# Patient Record
Sex: Male | Born: 1950 | Race: White | Hispanic: No | Marital: Married | Smoking: Never smoker
Health system: Southern US, Community
[De-identification: ages and names within clinical notes are randomized; demographics above are authoritative.]

## PROBLEM LIST (undated history)

## (undated) DIAGNOSIS — G4730 Sleep apnea, unspecified: Secondary | ICD-10-CM

## (undated) DIAGNOSIS — N289 Disorder of kidney and ureter, unspecified: Secondary | ICD-10-CM

## (undated) DIAGNOSIS — K219 Gastro-esophageal reflux disease without esophagitis: Secondary | ICD-10-CM

## (undated) DIAGNOSIS — M1990 Unspecified osteoarthritis, unspecified site: Secondary | ICD-10-CM

## (undated) DIAGNOSIS — I10 Essential (primary) hypertension: Secondary | ICD-10-CM

## (undated) HISTORY — PX: JOINT REPLACEMENT: SHX530

## (undated) HISTORY — PX: TONSILLECTOMY: SUR1361

## (undated) HISTORY — PX: TUMOR REMOVAL: SHX12

---

## 2013-03-30 ENCOUNTER — Other Ambulatory Visit: Payer: Self-pay | Admitting: Gastroenterology

## 2013-06-19 ENCOUNTER — Encounter (HOSPITAL_COMMUNITY): Payer: Self-pay | Admitting: *Deleted

## 2013-06-22 ENCOUNTER — Encounter (HOSPITAL_COMMUNITY): Payer: Self-pay | Admitting: Pharmacy Technician

## 2013-07-13 ENCOUNTER — Other Ambulatory Visit: Payer: Self-pay | Admitting: Gastroenterology

## 2013-07-14 ENCOUNTER — Encounter (HOSPITAL_COMMUNITY): Payer: Self-pay | Admitting: Anesthesiology

## 2013-07-14 ENCOUNTER — Encounter (HOSPITAL_COMMUNITY)
Admission: RE | Disposition: A | Discharge status: Home / Self Care | Payer: Self-pay | Source: Ambulatory Visit | Attending: Gastroenterology

## 2013-07-14 ENCOUNTER — Ambulatory Visit (HOSPITAL_COMMUNITY): Payer: Federal, State, Local not specified - PPO | Admitting: Anesthesiology

## 2013-07-14 ENCOUNTER — Ambulatory Visit (HOSPITAL_COMMUNITY)
Admission: RE | Admit: 2013-07-14 | Discharge: 2013-07-14 | Disposition: A | Discharge status: Home / Self Care | Payer: Federal, State, Local not specified - PPO | Source: Ambulatory Visit | Attending: Gastroenterology | Admitting: Gastroenterology

## 2013-07-14 DIAGNOSIS — D129 Benign neoplasm of anus and anal canal: Secondary | ICD-10-CM | POA: Insufficient documentation

## 2013-07-14 DIAGNOSIS — Z1211 Encounter for screening for malignant neoplasm of colon: Secondary | ICD-10-CM | POA: Insufficient documentation

## 2013-07-14 DIAGNOSIS — Z800 Family history of malignant neoplasm of digestive organs: Secondary | ICD-10-CM | POA: Insufficient documentation

## 2013-07-14 DIAGNOSIS — D126 Benign neoplasm of colon, unspecified: Secondary | ICD-10-CM | POA: Insufficient documentation

## 2013-07-14 DIAGNOSIS — D128 Benign neoplasm of rectum: Secondary | ICD-10-CM | POA: Insufficient documentation

## 2013-07-14 DIAGNOSIS — J310 Chronic rhinitis: Secondary | ICD-10-CM | POA: Insufficient documentation

## 2013-07-14 DIAGNOSIS — Q602 Renal agenesis, unspecified: Secondary | ICD-10-CM | POA: Insufficient documentation

## 2013-07-14 DIAGNOSIS — E7800 Pure hypercholesterolemia, unspecified: Secondary | ICD-10-CM | POA: Insufficient documentation

## 2013-07-14 HISTORY — DX: Gastro-esophageal reflux disease without esophagitis: K21.9

## 2013-07-14 HISTORY — PX: COLONOSCOPY WITH PROPOFOL: SHX5780

## 2013-07-14 HISTORY — DX: Unspecified osteoarthritis, unspecified site: M19.90

## 2013-07-14 HISTORY — DX: Sleep apnea, unspecified: G47.30

## 2013-07-14 SURGERY — COLONOSCOPY WITH PROPOFOL
Anesthesia: Monitor Anesthesia Care

## 2013-07-14 MED ORDER — MIDAZOLAM HCL 5 MG/5ML IJ SOLN
INTRAMUSCULAR | Status: DC | PRN
  Administered 2013-07-14: 2 mg via INTRAVENOUS

## 2013-07-14 MED ORDER — LACTATED RINGERS IV SOLN
INTRAVENOUS | Status: DC
  Administered 2013-07-14: 09:00:00 via INTRAVENOUS

## 2013-07-14 MED ORDER — SODIUM CHLORIDE 0.9 % IV SOLN: INTRAVENOUS | Status: DC

## 2013-07-14 MED ORDER — PROPOFOL INFUSION 10 MG/ML OPTIME
INTRAVENOUS | Status: DC | PRN
  Administered 2013-07-14: 100 ug/kg/min via INTRAVENOUS

## 2013-07-14 MED ORDER — KETAMINE HCL 10 MG/ML IJ SOLN
INTRAMUSCULAR | Status: DC | PRN
  Administered 2013-07-14: 20 mg via INTRAVENOUS

## 2013-07-14 MED ORDER — FENTANYL CITRATE 0.05 MG/ML IJ SOLN
INTRAMUSCULAR | Status: DC | PRN
  Administered 2013-07-14: 100 ug via INTRAVENOUS

## 2013-07-14 MED ORDER — PROPOFOL INFUSION 10 MG/ML OPTIME: INTRAVENOUS | Status: DC | PRN

## 2013-07-14 SURGICAL SUPPLY — 21 items

## 2013-07-14 NOTE — Transfer of Care (Signed)
Immediate Anesthesia Transfer of Care Note  Patient: Willie Franco  Procedure(s) Performed: Procedure(s): COLONOSCOPY WITH PROPOFOL (N/A)  Patient Location: PACU  Anesthesia Type:MAC  Level of Consciousness: sedated  Airway & Oxygen Therapy: Patient Spontanous Breathing and Patient connected to face mask oxygen  Post-op Assessment: Report given to PACU RN and Post -op Vital signs reviewed and stable  Post vital signs: Reviewed and stable  Complications: No apparent anesthesia complications

## 2013-07-14 NOTE — H&P (Signed)
  Procedure: Screening colonoscopy. Brother diagnosed with colon cancer at age 61  History: The patient is a 62 year old male born 10/12/1951. Patient underwent a screening colonoscopy at age 62 in 64. [place], [place]. His brother was diagnosed with colon cancer at age 64.  The patient is scheduled to undergo a repeat screening colonoscopy using propofol sedation.  Past medical history: Rhinitis. Vertebral fracture playing football in 1970. 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 Hypercholesterolemia. Tonsillectomy.  Family history: Brother diagnosed with colon cancer at age 61  Habits: The patient has never smoked cigarettes and does not consume alcohol  Exam: The patient is alert and lying comfortably on the endoscopy stretcher. Abdomen is soft and nontender to palpation. Cardiac exam reveals a regular rhythm. Lungs are clear to auscultation.  Plan: Proceed with screening colonoscopy.

## 2013-07-14 NOTE — Op Note (Signed)
Procedure: Screening colonoscopy. Brother diagnosed with colon cancer at age 61.  Endoscopist: Danise Edge  Premedication: Propofol administered by anesthesia  Procedure: The patient was placed in the left lateral decubitus position. Anal inspection was normal. The Pentax pediatric colonoscope was introduced into the rectum and advanced to the cecum. A normal-appearing ileocecal valve and appendiceal orifice were identified. Colonic preparation for the exam today was good.  Rectum. From the distal rectum, a 6 mm sessile polyp was removed with the electrocautery snare and submitted for pathologic interpretation. Retroflexed view of the distal rectum was normal.   Sigmoid colon and descending colon. Normal.  Splenic flexure. Normal.  Transverse colon. From the mid transverse colon, a 3 mm sessile polyp was removed with the cold biopsy forceps and submitted for pathologic interpretation.  Hepatic flexure. Normal.  Ascending colon. Normal.  Cecum and ileocecal valve. Normal.  Assessment:  #1. From the distal rectum, a 6 mm sessile polyp was removed with the electrocautery snare  #2. From the mid transverse colon, a 3 mm sessile polyp was removed with the cold biopsy forceps  Recommendations: Schedule repeat colonoscopy in 5 years.

## 2013-07-14 NOTE — Anesthesia Preprocedure Evaluation (Signed)
Anesthesia Evaluation  Patient identified by MRN, date of birth, ID band Patient awake    Reviewed: Allergy & Precautions, H&P , NPO status , Patient's Chart, lab work & pertinent test results  Airway Mallampati: II TM Distance: >3 FB Neck ROM: Full    Dental no notable dental hx.    Pulmonary sleep apnea ,  breath sounds clear to auscultation  Pulmonary exam normal       Cardiovascular negative cardio ROS  Rhythm:Regular Rate:Normal     Neuro/Psych negative neurological ROS  negative psych ROS   GI/Hepatic Neg liver ROS, GERD-  Medicated,  Endo/Other  negative endocrine ROS  Renal/GU negative Renal ROS  negative genitourinary   Musculoskeletal negative musculoskeletal ROS (+)   Abdominal (+) + obese,   Peds negative pediatric ROS (+)  Hematology negative hematology ROS (+)   Anesthesia Other Findings   Reproductive/Obstetrics negative OB ROS                           Anesthesia Physical Anesthesia Plan  ASA: II  Anesthesia Plan: MAC   Post-op Pain Management:    Induction: Intravenous  Airway Management Planned:   Additional Equipment:   Intra-op Plan:   Post-operative Plan:   Informed Consent: I have reviewed the patients History and Physical, chart, labs and discussed the procedure including the risks, benefits and alternatives for the proposed anesthesia with the patient or authorized representative who has indicated his/her understanding and acceptance.   Dental advisory given  Plan Discussed with: CRNA  Anesthesia Plan Comments:         Anesthesia Quick Evaluation

## 2013-07-14 NOTE — Anesthesia Postprocedure Evaluation (Signed)
  Anesthesia Post-op Note  Patient: Willie Franco  Procedure(s) Performed: Procedure(s) (LRB): COLONOSCOPY WITH PROPOFOL (N/A)  Patient Location: PACU  Anesthesia Type: MAC  Level of Consciousness: awake and alert   Airway and Oxygen Therapy: Patient Spontanous Breathing  Post-op Pain: mild  Post-op Assessment: Post-op Vital signs reviewed, Patient's Cardiovascular Status Stable, Respiratory Function Stable, Patent Airway and No signs of Nausea or vomiting  Last Vitals:  Filed Vitals:   07/14/13 1020  BP: 130/80  Pulse:   Temp:   Resp: 12    Post-op Vital Signs: stable   Complications: No apparent anesthesia complications

## 2013-07-15 ENCOUNTER — Encounter (HOSPITAL_COMMUNITY): Payer: Self-pay | Admitting: Gastroenterology

## 2014-06-25 ENCOUNTER — Emergency Department (HOSPITAL_COMMUNITY): Payer: 59

## 2014-06-25 ENCOUNTER — Emergency Department (INDEPENDENT_AMBULATORY_CARE_PROVIDER_SITE_OTHER)
Admission: EM | Admit: 2014-06-25 | Discharge: 2014-06-25 | Disposition: A | Discharge status: Home / Self Care | Payer: 59 | Source: Home / Self Care

## 2014-06-25 ENCOUNTER — Emergency Department (HOSPITAL_COMMUNITY)
Admission: EM | Admit: 2014-06-25 | Discharge: 2014-06-25 | Disposition: A | Discharge status: Home / Self Care | Payer: 59 | Attending: Emergency Medicine | Admitting: Emergency Medicine

## 2014-06-25 ENCOUNTER — Emergency Department (INDEPENDENT_AMBULATORY_CARE_PROVIDER_SITE_OTHER): Payer: 59

## 2014-06-25 ENCOUNTER — Encounter (HOSPITAL_COMMUNITY): Payer: Self-pay | Admitting: Emergency Medicine

## 2014-06-25 DIAGNOSIS — K219 Gastro-esophageal reflux disease without esophagitis: Secondary | ICD-10-CM | POA: Insufficient documentation

## 2014-06-25 DIAGNOSIS — N23 Unspecified renal colic: Secondary | ICD-10-CM

## 2014-06-25 DIAGNOSIS — Z8739 Personal history of other diseases of the musculoskeletal system and connective tissue: Secondary | ICD-10-CM | POA: Insufficient documentation

## 2014-06-25 DIAGNOSIS — R109 Unspecified abdominal pain: Secondary | ICD-10-CM | POA: Insufficient documentation

## 2014-06-25 DIAGNOSIS — Z9889 Other specified postprocedural states: Secondary | ICD-10-CM | POA: Insufficient documentation

## 2014-06-25 DIAGNOSIS — N200 Calculus of kidney: Secondary | ICD-10-CM

## 2014-06-25 DIAGNOSIS — Z79899 Other long term (current) drug therapy: Secondary | ICD-10-CM | POA: Insufficient documentation

## 2014-06-25 LAB — CBC
HEMATOCRIT: 42.6 % (ref 39.0–52.0)
HEMOGLOBIN: 14.4 g/dL (ref 13.0–17.0)
MCH: 29.3 pg (ref 26.0–34.0)
MCHC: 33.8 g/dL (ref 30.0–36.0)
MCV: 86.8 fL (ref 78.0–100.0)
Platelets: 257 10*3/uL (ref 150–400)
RBC: 4.91 MIL/uL (ref 4.22–5.81)
RDW: 12.3 % (ref 11.5–15.5)
WBC: 12.8 10*3/uL — ABNORMAL HIGH (ref 4.0–10.5)

## 2014-06-25 LAB — POCT URINALYSIS DIP (DEVICE)
BILIRUBIN URINE: NEGATIVE
GLUCOSE, UA: NEGATIVE mg/dL
Ketones, ur: NEGATIVE mg/dL
Leukocytes, UA: NEGATIVE
Nitrite: NEGATIVE
PH: 5.5 (ref 5.0–8.0)
Protein, ur: NEGATIVE mg/dL
SPECIFIC GRAVITY, URINE: 1.015 (ref 1.005–1.030)
Urobilinogen, UA: 0.2 mg/dL (ref 0.0–1.0)

## 2014-06-25 LAB — URINALYSIS, ROUTINE W REFLEX MICROSCOPIC
BILIRUBIN URINE: NEGATIVE
GLUCOSE, UA: NEGATIVE mg/dL
KETONES UR: NEGATIVE mg/dL
LEUKOCYTES UA: NEGATIVE
Nitrite: NEGATIVE
PH: 5 (ref 5.0–8.0)
Protein, ur: NEGATIVE mg/dL
Specific Gravity, Urine: 1.009 (ref 1.005–1.030)
Urobilinogen, UA: 0.2 mg/dL (ref 0.0–1.0)

## 2014-06-25 LAB — BASIC METABOLIC PANEL
Anion gap: 12 (ref 5–15)
BUN: 16 mg/dL (ref 6–23)
CALCIUM: 9.1 mg/dL (ref 8.4–10.5)
CO2: 24 meq/L (ref 19–32)
CREATININE: 1.07 mg/dL (ref 0.50–1.35)
Chloride: 100 mEq/L (ref 96–112)
GFR calc Af Amer: 85 mL/min — ABNORMAL LOW (ref >90)
GFR calc non Af Amer: 73 mL/min — ABNORMAL LOW (ref >90)
GLUCOSE: 135 mg/dL — AB (ref 70–99)
Potassium: 3.5 mEq/L — ABNORMAL LOW (ref 3.7–5.3)
Sodium: 136 mEq/L — ABNORMAL LOW (ref 137–147)

## 2014-06-25 LAB — URINE MICROSCOPIC-ADD ON

## 2014-06-25 MED ORDER — HYDROCODONE-ACETAMINOPHEN 5-325 MG PO TABS: 1.0000 | ORAL_TABLET | Freq: Four times a day (QID) | ORAL | Status: DC | PRN

## 2014-06-25 MED ORDER — TAMSULOSIN HCL 0.4 MG PO CAPS: 0.4000 mg | ORAL_CAPSULE | Freq: Every day | ORAL | Status: AC

## 2014-06-25 MED ORDER — TAMSULOSIN HCL 0.4 MG PO CAPS: 0.4000 mg | ORAL_CAPSULE | Freq: Every day | ORAL | Status: DC

## 2014-06-25 NOTE — ED Notes (Signed)
MD at bedside. Dr. Walden at bedside.  

## 2014-06-25 NOTE — ED Provider Notes (Signed)
CSN: [id number]     Arrival date & time 06/25/14  1352 History   None    Chief Complaint  Patient presents with  . Flank Pain   (Consider location/radiation/quality/duration/timing/severity/associated sxs/prior Treatment) Patient is a 63 y.o. male presenting with flank pain. The history is provided by the patient and the spouse.  Flank Pain This is a new problem. The current episode started 6 to 12 hours ago. The problem has not changed since onset.Associated symptoms include abdominal pain. Pertinent negatives include no chest pain. Associated symptoms comments: Pain at 2am initially a 9/10, now 2-3/10.Marland Kitchen Nothing aggravates the symptoms.    Past Medical History  Diagnosis Date  . Sleep apnea     no CPAP at present  . Arthritis   . GERD (gastroesophageal reflux disease)    Past Surgical History  Procedure Laterality Date  . Tonsillectomy      age 53  . 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 Colonoscopy with propofol N/A 07/14/2013    Procedure: COLONOSCOPY WITH PROPOFOL;  Surgeon: Garlan Fair, MD;  Location: [facility];  Service: Endoscopy;  Laterality: N/A;   History reviewed. No pertinent family history. History  Substance Use Topics  . Smoking status: Never Smoker   . Smokeless tobacco: Not on file  . Alcohol Use: No    Review of Systems  Cardiovascular: Negative for chest pain.  Gastrointestinal: Positive for nausea and abdominal pain. Negative for vomiting and diarrhea.  Genitourinary: Positive for flank pain. Negative for dysuria, urgency, frequency and hematuria.    Allergies  Review of patient's allergies indicates no known allergies.  Home Medications   Prior to Admission medications   Medication Sig Start Date End Date Taking? Authorizing Provider  Chromium Picolinate 800 MCG TABS Take 800 mcg by mouth daily.   Yes Historical Provider, MD  loratadine-pseudoephedrine (CLARITIN-D 12-HOUR) 5-120 MG per tablet Take 1 tablet by mouth 2 (two) times daily.    Yes Historical Provider, MD  diphenhydrAMINE (BENADRYL) 25 mg capsule Take 25 mg by mouth at bedtime as needed for allergies or sleep.    Historical Provider, MD  HYDROcodone-acetaminophen (NORCO/VICODIN) 5-325 MG per tablet Take 1 tablet by mouth every 6 (six) hours as needed for moderate pain. 06/25/14   Osvaldo Shipper, MD  MAGNESIUM PO Take 1 tablet by mouth daily.    Historical Provider, MD  tamsulosin (FLOMAX) 0.4 MG CAPS capsule Take 1 capsule (0.4 mg total) by mouth daily. 06/25/14   Osvaldo Shipper, MD   BP 162/98  Pulse 74  Temp(Src) 98.4 F (36.9 C) (Oral)  Resp 18  SpO2 96% Physical Exam  Nursing note and vitals reviewed. Constitutional: He is oriented to person, place, and time. He appears well-developed and well-nourished. No distress.  Abdominal: Soft. Bowel sounds are normal. He exhibits no distension and no mass. There is no hepatosplenomegaly. There is tenderness. There is CVA tenderness. There is no rigidity, no rebound, no guarding, no tenderness at McBurney's point and negative Murphy's sign.  Neurological: He is alert and oriented to person, place, and time.  Skin: Skin is warm and dry.    ED Course  Procedures (including critical care time) Labs Review Labs Reviewed  POCT URINALYSIS DIP (DEVICE) - Abnormal; Notable for the following:    Hgb urine dipstick MODERATE (*)    All other components within normal limits    Imaging Review Ct Abdomen Pelvis Wo Contrast  06/25/2014   CLINICAL DATA:  Right  flank pain.  EXAM: CT ABDOMEN AND PELVIS WITHOUT CONTRAST  TECHNIQUE: Multidetector CT imaging of the abdomen and pelvis was performed following the standard protocol without IV contrast.  COMPARISON:  KUB 06/25/2014 .  FINDINGS: Multiple cysts within the liver, the largest measures 3 cm and is in the periphery of the right lobe of the liver. Spleen normal. Pancreas normal. No biliary distention. The gallbladder nondistended.  Adrenals normal. Right hydronephrosis  and hydroureter to the level of the bladder is present. Tiny 2 mm stone is noted in the bladder within the region of the right ureterovesical junction most consistent with a recently passed stone. Left renal atrophy with calcifications present. Left hydronephrosis noted from what is most likely a chronic left ureterocele. The bladder is nondistended. Prostate is slightly enlarged and slightly nodular, urologic evaluation suggested.  Shotty inguinal and retroperitoneal adenopathy is present. Abdominal aorta normal in caliber. No aneurysm.  No evidence of bowel distention.  No free air.  Heart size normal. Lung bases clear. Degenerative changes thoracolumbar spine. Degenerative changes both hips. Tiny sclerotic densities in the right is femur statistically most likely bone islands. No other bony lesions are noted to suggest widespread blastic metastatic disease.  IMPRESSION: 1. 2 mm stone the bladder adjacent to the right ureterovesical junction consistent with recently passed stone. Associated mild right hydronephrosis and hydroureter. 2. Left renal atrophy. Chronic left hydronephrosis and hydroureter secondary to chronic left ureterocele. 3. Enlarged nodular prostate for which urologic consultation suggested.   Electronically Signed   By: Marcello Moores  Register   On: 06/25/2014 19:24   Dg Abd 1 View  06/25/2014   CLINICAL DATA:  Pain  EXAM: ABDOMEN - 1 VIEW  COMPARISON:  None.  FINDINGS: The bowel gas pattern is normal. No radio-opaque calculi or other significant radiographic abnormality are seen. Multiple calcifications in the pelvis likely reflecting phleboliths.  Mild osteoarthritis of the right hip.  IMPRESSION: Unremarkable KUB.   Electronically Signed   By: Kathreen Devoid   On: 06/25/2014 16:24     MDM   1. Ureteral colic    Referred to dr Gaynelle Arabian at [facility] for further eval and care. Will see at [facility].    Billy Fischer, MD 06/26/14 [phone]

## 2014-06-25 NOTE — ED Notes (Addendum)
Per patient-right flank pain started at 0230 this morning described as "an aching pain." No hx kidney stones. Pt was born with one kidney. Was seen at urgent care today and was told "I had blood in my urine." Patient reports he has not noticed blood in his urine but feels like his right flank pain and "moved down about three inches." Had N, chills, and was diaphoretic this morning. Denies abdominal pain or pelvic pain and denies fevers. Took 50 mg Tramadol at 1200 today. Experienced some alleviation of symptoms and now rates pain at a 2-3/10. In NAD. Moving all extremities equally. Speaking full, clear sentences. Not currently on any blood thinners. Awaiting MD.

## 2014-06-25 NOTE — ED Notes (Signed)
Patient transported to CT 

## 2014-06-25 NOTE — ED Provider Notes (Signed)
CSN: [id number]     Arrival date & time 06/25/14  1726 History   First MD Initiated Contact with Patient 06/25/14 1748     Chief Complaint  Patient presents with  . Hematuria  . Flank Pain    right     (Consider location/radiation/quality/duration/timing/severity/associated sxs/prior Treatment) Patient is a 63 y.o. male presenting with hematuria, flank pain, and abdominal pain. The history is provided by the patient.  Hematuria The problem has not changed since onset.Pertinent negatives include no abdominal pain and no shortness of breath.  Flank Pain Pertinent negatives include no abdominal pain and no shortness of breath.  Abdominal Pain Pain location:  R flank Pain quality: aching   Pain radiates to:  Does not radiate Pain severity:  Moderate Onset quality:  Sudden Timing:  Intermittent Progression:  Worsening Chronicity:  New Context: not diet changes, not eating, not laxative use, not recent illness, not sick contacts and not trauma   Relieved by:  Nothing Associated symptoms: hematuria and nausea   Associated symptoms: no cough, no fever, no shortness of breath and no vomiting     Past Medical History  Diagnosis Date  . Sleep apnea     no CPAP at present  . Arthritis   . GERD (gastroesophageal reflux disease)    Past Surgical History  Procedure Laterality Date  . Tonsillectomy      age 61  . 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 Colonoscopy with propofol N/A 07/14/2013    Procedure: COLONOSCOPY WITH PROPOFOL;  Surgeon: Garlan Fair, MD;  Location: [facility];  Service: Endoscopy;  Laterality: N/A;   History reviewed. No pertinent family history. History  Substance Use Topics  . Smoking status: Never Smoker   . Smokeless tobacco: Not on file  . Alcohol Use: No    Review of Systems  Constitutional: Negative for fever.  Respiratory: Negative for cough and shortness of breath.   Gastrointestinal: Positive for nausea. Negative for vomiting and  abdominal pain.  Genitourinary: Positive for hematuria and flank pain.  All other systems reviewed and are negative.     Allergies  Review of patient's allergies indicates no known allergies.  Home Medications   Prior to Admission medications   Medication Sig Start Date End Date Taking? Authorizing Provider  Chromium Picolinate 800 MCG TABS Take 800 mcg by mouth daily.   Yes Historical Provider, MD  diphenhydrAMINE (BENADRYL) 25 mg capsule Take 25 mg by mouth at bedtime as needed for allergies or sleep.   Yes Historical Provider, MD  loratadine-pseudoephedrine (CLARITIN-D 12-HOUR) 5-120 MG per tablet Take 1 tablet by mouth 2 (two) times daily.   Yes Historical Provider, MD  MAGNESIUM PO Take 1 tablet by mouth daily.   Yes Historical Provider, MD   BP 141/76  Pulse 74  Temp(Src) 98 F (36.7 C) (Oral)  SpO2 100% Physical Exam  Constitutional: He is oriented to person, place, and time. He appears well-developed and well-nourished. No distress.  HENT:  Head: Normocephalic and atraumatic.  Mouth/Throat: Oropharynx is clear and moist. No oropharyngeal exudate.  Eyes: EOM are normal. Pupils are equal, round, and reactive to light.  Neck: Normal range of motion. Neck supple.  Cardiovascular: Normal rate and regular rhythm.  Exam reveals no friction rub.   No murmur heard. Pulmonary/Chest: Effort normal and breath sounds normal. No respiratory distress. He has no wheezes. He has no rales.  Abdominal: He exhibits no distension. There is tenderness (R flank). There  is no rebound.  Musculoskeletal: Normal range of motion. He exhibits no edema.  Neurological: He is alert and oriented to person, place, and time.  Skin: He is not diaphoretic.    ED Course  Procedures (including critical care time) Labs Review Labs Reviewed  CBC  BASIC METABOLIC PANEL  URINALYSIS, ROUTINE W REFLEX MICROSCOPIC    Imaging Review Dg Abd 1 View  06/25/2014   CLINICAL DATA:  Pain  EXAM: ABDOMEN - 1 VIEW   COMPARISON:  None.  FINDINGS: The bowel gas pattern is normal. No radio-opaque calculi or other significant radiographic abnormality are seen. Multiple calcifications in the pelvis likely reflecting phleboliths.  Mild osteoarthritis of the right hip.  IMPRESSION: Unremarkable KUB.   Electronically Signed   By: Kathreen Devoid   On: 06/25/2014 16:24     EKG Interpretation None      MDM   Final diagnoses:  Kidney stone    63 year old male here with right flank pain. Began this morning, described as colicky. No relief with tramadol. Associated nausea but no fevers or vomiting. Seen in urgent care, was told he had hematuria. Sent here for further eval by urology. I evaluated patient prior to knowing urology this coming ordered labs and CT scan. He has a history of having only one kidney which is congenital. He refused pain meds. CT shows stone in bladder, next to UVJ. Urology looked at scan, felt he was ok to be discharged with normal labs and since patient was well-appearing. Put on flomax, given pain meds. Given strict return precautions, including oliguria. He is aware with his congenital kidney he would need emergent intervention if he worsens. Given strainer and instructed to f/u with Urology next week.   Osvaldo Shipper, MD 06/25/14 2130

## 2014-06-25 NOTE — Discharge Instructions (Signed)

## 2014-06-25 NOTE — ED Notes (Signed)
Reports acute on set of right flank pain at 2 a.m.  Having sweats and nausea.  Pt used tramadol and increased fluids.  States "weird sensation with voiding"

## 2014-10-25 ENCOUNTER — Inpatient Hospital Stay (HOSPITAL_COMMUNITY)
Admission: EM | Admit: 2014-10-25 | Discharge: 2014-10-29 | DRG: 862 | Disposition: A | Discharge status: Home / Self Care | Payer: 59 | Attending: Family Medicine | Admitting: Family Medicine

## 2014-10-25 ENCOUNTER — Encounter (HOSPITAL_COMMUNITY): Payer: Self-pay | Admitting: *Deleted

## 2014-10-25 DIAGNOSIS — N400 Enlarged prostate without lower urinary tract symptoms: Secondary | ICD-10-CM | POA: Diagnosis present

## 2014-10-25 DIAGNOSIS — Q639 Congenital malformation of kidney, unspecified: Secondary | ICD-10-CM

## 2014-10-25 DIAGNOSIS — Z9989 Dependence on other enabling machines and devices: Secondary | ICD-10-CM

## 2014-10-25 DIAGNOSIS — M1990 Unspecified osteoarthritis, unspecified site: Secondary | ICD-10-CM | POA: Diagnosis present

## 2014-10-25 DIAGNOSIS — N12 Tubulo-interstitial nephritis, not specified as acute or chronic: Secondary | ICD-10-CM

## 2014-10-25 DIAGNOSIS — T814XXA Infection following a procedure, initial encounter: Principal | ICD-10-CM | POA: Diagnosis present

## 2014-10-25 DIAGNOSIS — Y848 Other medical procedures as the cause of abnormal reaction of the patient, or of later complication, without mention of misadventure at the time of the procedure: Secondary | ICD-10-CM | POA: Diagnosis present

## 2014-10-25 DIAGNOSIS — G4733 Obstructive sleep apnea (adult) (pediatric): Secondary | ICD-10-CM

## 2014-10-25 DIAGNOSIS — N1339 Other hydronephrosis: Secondary | ICD-10-CM | POA: Diagnosis present

## 2014-10-25 DIAGNOSIS — Q898 Other specified congenital malformations: Secondary | ICD-10-CM

## 2014-10-25 DIAGNOSIS — R319 Hematuria, unspecified: Secondary | ICD-10-CM | POA: Diagnosis present

## 2014-10-25 DIAGNOSIS — N10 Acute tubulo-interstitial nephritis: Secondary | ICD-10-CM | POA: Diagnosis present

## 2014-10-25 DIAGNOSIS — K219 Gastro-esophageal reflux disease without esophagitis: Secondary | ICD-10-CM

## 2014-10-25 DIAGNOSIS — A419 Sepsis, unspecified organism: Secondary | ICD-10-CM | POA: Diagnosis present

## 2014-10-25 HISTORY — DX: Disorder of kidney and ureter, unspecified: N28.9

## 2014-10-25 LAB — URINE MICROSCOPIC-ADD ON

## 2014-10-25 LAB — URINALYSIS, ROUTINE W REFLEX MICROSCOPIC
Bilirubin Urine: NEGATIVE
GLUCOSE, UA: NEGATIVE mg/dL
Ketones, ur: NEGATIVE mg/dL
NITRITE: NEGATIVE
PH: 6 (ref 5.0–8.0)
Protein, ur: >300 mg/dL — AB
Specific Gravity, Urine: 1.028 (ref 1.005–1.030)
Urobilinogen, UA: 1 mg/dL (ref 0.0–1.0)

## 2014-10-25 MED ORDER — SODIUM CHLORIDE 0.9 % IV BOLUS (SEPSIS)
1000.0000 mL | Freq: Once | INTRAVENOUS | Status: AC
  Administered 2014-10-26: 1000 mL via INTRAVENOUS

## 2014-10-25 MED ORDER — LEVOFLOXACIN IN D5W 750 MG/150ML IV SOLN
750.0000 mg | Freq: Once | INTRAVENOUS | Status: AC
  Administered 2014-10-26: 750 mg via INTRAVENOUS
  Filled 2014-10-25: qty 150

## 2014-10-25 MED ORDER — DEXTROSE 5 % IV SOLN
1.0000 g | Freq: Once | INTRAVENOUS | Status: DC
  Filled 2014-10-25: qty 10

## 2014-10-25 NOTE — ED Provider Notes (Signed)
CSN: [id number]     Arrival date & time 10/25/14  2134 History   First MD Initiated Contact with Patient 10/25/14 2258     Chief Complaint  Patient presents with  . Flank Pain     (Consider location/radiation/quality/duration/timing/severity/associated sxs/prior Treatment) HPI  Willie Franco is a 63 y.o. male with past medical history of single right kidney coming in with hematuria, dysuria and fever. Patient states he first extends hematuria last week and was seen by urology. He had a negative CT scan and cystoscope. Over the course of week she's had worsening dysuria and cloudy urine. Fevers has 102.4. Patient took Motrin at 6 PM and Tylenol at 8 PM. Wife who is a Designer, jewellery is concern for urinary tract infection after instrumentation. Patient denies any other symptoms, he has no nausea vomiting or abdominal pain. He does have left-sided flank pain but is unsure if this is due to the instrumentation.  Patient states he had 4 bowel movements today, they were not liquid, however they were painful. Patient has no further complaints.   10 Systems reviewed and are negative for acute change except as noted in the HPI.   Past Medical History  Diagnosis Date  . Sleep apnea     no CPAP at present  . Arthritis   . GERD (gastroesophageal reflux disease)   . Renal disorder     only has rt kidney   Past Surgical History  Procedure Laterality Date  . Tonsillectomy      age 89  . 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 Colonoscopy with propofol N/A 07/14/2013    Procedure: COLONOSCOPY WITH PROPOFOL;  Surgeon: Garlan Fair, MD;  Location: [facility];  Service: Endoscopy;  Laterality: N/A;   No family history on file. History  Substance Use Topics  . Smoking status: Never Smoker   . Smokeless tobacco: Not on file  . Alcohol Use: No    Review of Systems    Allergies  Review of patient's allergies indicates no known allergies.  Home Medications   Prior to Admission  medications   Medication Sig Start Date End Date Taking? Authorizing Provider  acetaminophen (TYLENOL) 500 MG tablet Take 1,000 mg by mouth every 6 (six) hours as needed for fever (fever).   Yes Historical Provider, MD  Chromium Picolinate 800 MCG TABS Take 800 mcg by mouth daily.   Yes Historical Provider, MD  diphenhydrAMINE (BENADRYL) 25 mg capsule Take 25 mg by mouth at bedtime as needed for allergies or sleep (sleep).    Yes Historical Provider, MD  finasteride (PROSCAR) 5 MG tablet Take 5 mg by mouth daily.   Yes Historical Provider, MD  fluticasone (FLONASE) 50 MCG/ACT nasal spray Place 2 sprays into both nostrils daily.   Yes Historical Provider, MD  ibuprofen (ADVIL,MOTRIN) 200 MG tablet Take 600 mg by mouth every 6 (six) hours as needed (fever).   Yes Historical Provider, MD  loratadine-pseudoephedrine (CLARITIN-D 12-HOUR) 5-120 MG per tablet Take 1 tablet by mouth 2 (two) times daily.   Yes Historical Provider, MD  MAGNESIUM PO Take 1 tablet by mouth daily.   Yes Historical Provider, MD  tamsulosin (FLOMAX) 0.4 MG CAPS capsule Take 1 capsule (0.4 mg total) by mouth daily. 06/25/14  Yes Evelina Bucy, MD  HYDROcodone-acetaminophen (NORCO/VICODIN) 5-325 MG per tablet Take 1 tablet by mouth every 6 (six) hours as needed for moderate pain. 06/25/14   Evelina Bucy, MD   BP 114/70 mmHg  Pulse 111  Temp(Src) 98.6 F (37 C) (Oral)  Resp 20  SpO2 95% Physical Exam  Constitutional: He is oriented to person, place, and time. Vital signs are normal. He appears well-developed and well-nourished.  Non-toxic appearance. He does not appear ill. No distress.  HENT:  Head: Normocephalic and atraumatic.  Nose: Nose normal.  Mouth/Throat: Oropharynx is clear and moist. No oropharyngeal exudate.  Eyes: Conjunctivae and EOM are normal. Pupils are equal, round, and reactive to light. No scleral icterus.  Neck: Normal range of motion. Neck supple. No tracheal deviation, no edema, no erythema and normal  range of motion present. No thyroid mass and no thyromegaly present.  Cardiovascular: Normal rate, regular rhythm, S1 normal, S2 normal, normal heart sounds, intact distal pulses and normal pulses.  Exam reveals no gallop and no friction rub.   No murmur heard. Pulses:      Radial pulses are 2+ on the right side, and 2+ on the left side.       Dorsalis pedis pulses are 2+ on the right side, and 2+ on the left side.  Pulmonary/Chest: Effort normal and breath sounds normal. No respiratory distress. He has no wheezes. He has no rhonchi. He has no rales.  Abdominal: Soft. Normal appearance and bowel sounds are normal. He exhibits no distension, no ascites and no mass. There is no hepatosplenomegaly. There is no tenderness. There is no rebound, no guarding and no CVA tenderness.  Genitourinary: Penis normal. No penile tenderness.  No scrotal swelling, no epididymal tenderness, no masses palpated.  Musculoskeletal: Normal range of motion. He exhibits no edema or tenderness.  Lymphadenopathy:    He has no cervical adenopathy.  Neurological: He is alert and oriented to person, place, and time. He has normal strength. No cranial nerve deficit or sensory deficit. He exhibits normal muscle tone. GCS eye subscore is 4. GCS verbal subscore is 5. GCS motor subscore is 6.  Skin: Skin is warm, dry and intact. No petechiae and no rash noted. He is not diaphoretic. No erythema. No pallor.  Psychiatric: He has a normal mood and affect. His behavior is normal. Judgment normal.  Nursing note and vitals reviewed.   ED Course  Procedures (including critical care time) Labs Review Labs Reviewed  URINALYSIS, ROUTINE W REFLEX MICROSCOPIC - Abnormal; Notable for the following:    Color, Urine RED (*)    APPearance TURBID (*)    Hgb urine dipstick LARGE (*)    Protein, ur >300 (*)    Leukocytes, UA LARGE (*)    All other components within normal limits  CBC WITH DIFFERENTIAL - Abnormal; Notable for the following:     WBC 23.1 (*)    Neutrophils Relative % 80 (*)    Neutro Abs 18.4 (*)    Lymphocytes Relative 11 (*)    Monocytes Absolute 2.2 (*)    All other components within normal limits  BASIC METABOLIC PANEL - Abnormal; Notable for the following:    Glucose, Bld 116 (*)    GFR calc non Af Amer 62 (*)    GFR calc Af Amer 72 (*)    All other components within normal limits  URINE CULTURE  URINE MICROSCOPIC-ADD ON    Imaging Review No results found.   EKG Interpretation None      MDM   Final diagnoses:  None    Patient presents to the ED out of concern for a urine infection.  UA is positive.  Because patient has a single  kidney we'll obtain laboratory studies and evaluate creatinine. Patient will work hard inpatient admission for IV antibiotics to ensure pyelonephritis is treated.  Patient given Levaquin in the emergency department. White count is 23, creatinine is 1.2 up from 1.0. This is consistent with pyelonephritis. He'll be admitted to [facility] [place].    Everlene Balls, MD 10/26/14 [phone]

## 2014-10-25 NOTE — ED Notes (Signed)
Pt states that he began having left flank lower abd pain yesterday; pt states that he had blood in his urine last week and saw his urologist; pt states that he had a CT and cystoscope that were negative except enlarged ureter on the left side even though no kidney to the left; pt states that his urine cleared up but had become bloody and cloudy in the last couple of days; pt states that he developed a fever this afternoon and took Tylenol and Ibuprofen; pt afebrile upon arrival; pt states that he is concerned that he may be developing a UTI

## 2014-10-26 DIAGNOSIS — N1339 Other hydronephrosis: Secondary | ICD-10-CM | POA: Diagnosis present

## 2014-10-26 DIAGNOSIS — R319 Hematuria, unspecified: Secondary | ICD-10-CM | POA: Diagnosis present

## 2014-10-26 DIAGNOSIS — N10 Acute tubulo-interstitial nephritis: Secondary | ICD-10-CM | POA: Diagnosis present

## 2014-10-26 DIAGNOSIS — G4733 Obstructive sleep apnea (adult) (pediatric): Secondary | ICD-10-CM | POA: Diagnosis present

## 2014-10-26 DIAGNOSIS — T814XXA Infection following a procedure, initial encounter: Secondary | ICD-10-CM | POA: Diagnosis present

## 2014-10-26 DIAGNOSIS — K219 Gastro-esophageal reflux disease without esophagitis: Secondary | ICD-10-CM

## 2014-10-26 DIAGNOSIS — Q898 Other specified congenital malformations: Secondary | ICD-10-CM | POA: Diagnosis not present

## 2014-10-26 DIAGNOSIS — N12 Tubulo-interstitial nephritis, not specified as acute or chronic: Secondary | ICD-10-CM | POA: Diagnosis present

## 2014-10-26 DIAGNOSIS — A419 Sepsis, unspecified organism: Secondary | ICD-10-CM | POA: Diagnosis present

## 2014-10-26 DIAGNOSIS — M1990 Unspecified osteoarthritis, unspecified site: Secondary | ICD-10-CM | POA: Diagnosis present

## 2014-10-26 DIAGNOSIS — Y848 Other medical procedures as the cause of abnormal reaction of the patient, or of later complication, without mention of misadventure at the time of the procedure: Secondary | ICD-10-CM | POA: Diagnosis present

## 2014-10-26 DIAGNOSIS — Q639 Congenital malformation of kidney, unspecified: Secondary | ICD-10-CM

## 2014-10-26 DIAGNOSIS — Z9989 Dependence on other enabling machines and devices: Secondary | ICD-10-CM

## 2014-10-26 DIAGNOSIS — N400 Enlarged prostate without lower urinary tract symptoms: Secondary | ICD-10-CM | POA: Diagnosis present

## 2014-10-26 LAB — BASIC METABOLIC PANEL WITH GFR
Anion gap: 11 (ref 5–15)
BUN: 18 mg/dL (ref 6–23)
CO2: 26 meq/L (ref 19–32)
Calcium: 9 mg/dL (ref 8.4–10.5)
Chloride: 98 meq/L (ref 96–112)
Creatinine, Ser: 1.21 mg/dL (ref 0.50–1.35)
GFR calc Af Amer: 72 mL/min — ABNORMAL LOW (ref >90)
GFR calc non Af Amer: 62 mL/min — ABNORMAL LOW (ref >90)
Glucose, Bld: 123 mg/dL — ABNORMAL HIGH (ref 70–99)
Potassium: 4.1 meq/L (ref 3.7–5.3)
Sodium: 135 meq/L — ABNORMAL LOW (ref 137–147)

## 2014-10-26 LAB — CBC
HCT: 40.5 % (ref 39.0–52.0)
Hemoglobin: 13.6 g/dL (ref 13.0–17.0)
MCH: 29.7 pg (ref 26.0–34.0)
MCHC: 33.6 g/dL (ref 30.0–36.0)
MCV: 88.4 fL (ref 78.0–100.0)
Platelets: 234 K/uL (ref 150–400)
RBC: 4.58 MIL/uL (ref 4.22–5.81)
RDW: 12.5 % (ref 11.5–15.5)
WBC: 23.1 K/uL — ABNORMAL HIGH (ref 4.0–10.5)

## 2014-10-26 LAB — CBC WITH DIFFERENTIAL/PLATELET
Basophils Absolute: 0 10*3/uL (ref 0.0–0.1)
Basophils Relative: 0 % (ref 0–1)
Eosinophils Absolute: 0.1 10*3/uL (ref 0.0–0.7)
Eosinophils Relative: 0 % (ref 0–5)
HCT: 40.9 % (ref 39.0–52.0)
Hemoglobin: 14.1 g/dL (ref 13.0–17.0)
LYMPHS PCT: 11 % — AB (ref 12–46)
Lymphs Abs: 2.5 10*3/uL (ref 0.7–4.0)
MCH: 30.3 pg (ref 26.0–34.0)
MCHC: 34.5 g/dL (ref 30.0–36.0)
MCV: 88 fL (ref 78.0–100.0)
Monocytes Absolute: 2.2 10*3/uL — ABNORMAL HIGH (ref 0.1–1.0)
Monocytes Relative: 9 % (ref 3–12)
NEUTROS PCT: 80 % — AB (ref 43–77)
Neutro Abs: 18.4 10*3/uL — ABNORMAL HIGH (ref 1.7–7.7)
PLATELETS: 249 10*3/uL (ref 150–400)
RBC: 4.65 MIL/uL (ref 4.22–5.81)
RDW: 12.4 % (ref 11.5–15.5)
WBC: 23.1 10*3/uL — AB (ref 4.0–10.5)

## 2014-10-26 LAB — BASIC METABOLIC PANEL
ANION GAP: 14 (ref 5–15)
BUN: 18 mg/dL (ref 6–23)
CO2: 23 meq/L (ref 19–32)
Calcium: 9.1 mg/dL (ref 8.4–10.5)
Chloride: 100 mEq/L (ref 96–112)
Creatinine, Ser: 1.22 mg/dL (ref 0.50–1.35)
GFR calc Af Amer: 72 mL/min — ABNORMAL LOW (ref >90)
GFR, EST NON AFRICAN AMERICAN: 62 mL/min — AB (ref >90)
Glucose, Bld: 116 mg/dL — ABNORMAL HIGH (ref 70–99)
Potassium: 4.2 mEq/L (ref 3.7–5.3)
SODIUM: 137 meq/L (ref 137–147)

## 2014-10-26 MED ORDER — PANTOPRAZOLE SODIUM 40 MG PO TBEC
40.0000 mg | DELAYED_RELEASE_TABLET | Freq: Every day | ORAL | Status: DC
  Administered 2014-10-26 – 2014-10-29 (×4): 40 mg via ORAL
  Filled 2014-10-26 (×4): qty 1

## 2014-10-26 MED ORDER — HEPARIN SODIUM (PORCINE) 5000 UNIT/ML IJ SOLN
5000.0000 [IU] | Freq: Three times a day (TID) | INTRAMUSCULAR | Status: DC
  Administered 2014-10-26 – 2014-10-29 (×10): 5000 [IU] via SUBCUTANEOUS
  Filled 2014-10-26 (×13): qty 1

## 2014-10-26 MED ORDER — HYDROCODONE-ACETAMINOPHEN 5-325 MG PO TABS
1.0000 | ORAL_TABLET | ORAL | Status: DC | PRN
Start: 2014-10-26 — End: 2014-10-29
  Administered 2014-10-26 – 2014-10-27 (×6): 2 via ORAL
  Filled 2014-10-26 (×6): qty 2

## 2014-10-26 MED ORDER — MORPHINE SULFATE 4 MG/ML IJ SOLN: 4.0000 mg | INTRAMUSCULAR | Status: DC | PRN

## 2014-10-26 MED ORDER — VANCOMYCIN HCL IN DEXTROSE 1-5 GM/200ML-% IV SOLN
1000.0000 mg | Freq: Two times a day (BID) | INTRAVENOUS | Status: DC
  Administered 2014-10-26 – 2014-10-29 (×7): 1000 mg via INTRAVENOUS
  Filled 2014-10-26 (×7): qty 200

## 2014-10-26 MED ORDER — LEVOFLOXACIN IN D5W 750 MG/150ML IV SOLN
750.0000 mg | INTRAVENOUS | Status: DC
  Administered 2014-10-26 – 2014-10-28 (×3): 750 mg via INTRAVENOUS
  Filled 2014-10-26 (×3): qty 150

## 2014-10-26 MED ORDER — ONDANSETRON HCL 4 MG/2ML IJ SOLN
4.0000 mg | Freq: Four times a day (QID) | INTRAMUSCULAR | Status: DC | PRN
  Administered 2014-10-27: 4 mg via INTRAVENOUS
  Filled 2014-10-26: qty 2

## 2014-10-26 MED ORDER — MORPHINE SULFATE 4 MG/ML IJ SOLN
4.0000 mg | Freq: Once | INTRAMUSCULAR | Status: AC
  Administered 2014-10-26: 4 mg via INTRAVENOUS
  Filled 2014-10-26: qty 1

## 2014-10-26 MED ORDER — FINASTERIDE 5 MG PO TABS
5.0000 mg | ORAL_TABLET | Freq: Every day | ORAL | Status: DC
Start: 2014-10-26 — End: 2014-10-29
  Administered 2014-10-26 – 2014-10-29 (×4): 5 mg via ORAL
  Filled 2014-10-26 (×4): qty 1

## 2014-10-26 MED ORDER — SODIUM CHLORIDE 0.9 % IV SOLN
INTRAVENOUS | Status: DC
  Administered 2014-10-26: 100 mL/h via INTRAVENOUS
  Administered 2014-10-26 – 2014-10-28 (×6): via INTRAVENOUS

## 2014-10-26 MED ORDER — ACETAMINOPHEN 650 MG RE SUPP: 650.0000 mg | Freq: Four times a day (QID) | RECTAL | Status: DC | PRN

## 2014-10-26 MED ORDER — ACETAMINOPHEN 325 MG PO TABS
650.0000 mg | ORAL_TABLET | Freq: Four times a day (QID) | ORAL | Status: DC | PRN
  Administered 2014-10-26 – 2014-10-29 (×7): 650 mg via ORAL
  Filled 2014-10-26 (×7): qty 2

## 2014-10-26 MED ORDER — TAMSULOSIN HCL 0.4 MG PO CAPS
0.4000 mg | ORAL_CAPSULE | Freq: Every day | ORAL | Status: DC
  Administered 2014-10-26 – 2014-10-29 (×4): 0.4 mg via ORAL
  Filled 2014-10-26 (×4): qty 1

## 2014-10-26 MED ORDER — ONDANSETRON HCL 4 MG PO TABS: 4.0000 mg | ORAL_TABLET | Freq: Four times a day (QID) | ORAL | Status: DC | PRN

## 2014-10-26 NOTE — Progress Notes (Signed)
Patient seen and examined this morning, he was admitted overnight with sepsis due to urinary tract infection status post urologic procedure last week. - Continue levofloxacin, add vancomycin this morning to have better gram-positive coverage - He clinically is looking better and improved this morning - He still has a leukocytosis of 23K, continue IV antibiotics - Cultures are pending - No flank pain this morning   Jamil Castillo M. Cruzita Lederer, MD [facility] [phone]

## 2014-10-26 NOTE — H&P (Signed)
[facility] History [facility] Physical  Willie Franco WUJ:[id number] DOB: 1951/08/15 DOA: 10/25/2014  Referring physician: Dr Willie Franco Willie Franco PCP: Willie Channel, MD   Chief Complaint: side pain  HPI: Willie Franco is a 63 y.o. male  Hematuria, dysuria, and fevers. Seen by urology last week for hematuria. CT scan and cystoscope at that time were nml. Acutely developed dysuria and cloudy urine at 04:15 today. Fevers at home to 102.4. Motrin and tylenol w/ some improvement. Frequency. H/o congenital single kidney (R side)    Review of Systems:  Constitutional:  No weight loss, night sweats, Fevers, chills, fatigue.  HEENT:  No headaches, Difficulty swallowing,Tooth/dental problems,Sore throat,  No sneezing, itching, ear ache, nasal congestion, post nasal drip,  Cardio-vascular:  No chest pain, Orthopnea, PND, swelling in lower extremities, anasarca, dizziness, palpitations  GI:  No heartburn, indigestion, abdominal pain, nausea, vomiting, diarrhea, change in bowel habits, loss of appetite  Resp:  No shortness of breath with exertion or at rest. No excess mucus, no productive cough, No non-productive cough, No coughing up of blood.No change in color of mucus.No wheezing.No chest wall deformity  Skin:  no rash or lesions.  GU:  Per HPI Musculoskeletal:  No joint pain or swelling. No decreased range of motion. No back pain.  Psych:  No change in mood or affect. No depression or anxiety. No memory loss.   Past Medical History  Diagnosis Date  . Sleep apnea     no CPAP at present  . Arthritis   . GERD (gastroesophageal reflux disease)   . Renal disorder     only has rt kidney   Past Surgical History  Procedure Laterality Date  . Tonsillectomy      age 24  . 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 Colonoscopy with propofol N/A 07/14/2013    Procedure: COLONOSCOPY WITH PROPOFOL;  Surgeon: Willie Fair, MD;  Location: [facility];  Service: Endoscopy;  Laterality: N/A;    Social History:  reports that he has never smoked. He does not have any smokeless tobacco history on file. He reports that he does not drink alcohol or use illicit drugs.  No Known Allergies  No family history on file.   Prior to Admission medications   Medication Sig Start Date End Date Taking? Authorizing Provider  acetaminophen (TYLENOL) 500 MG tablet Take 1,000 mg by mouth every 6 (six) hours as needed for fever (fever).   Yes Historical Provider, MD  Chromium Picolinate 800 MCG TABS Take 800 mcg by mouth daily.   Yes Historical Provider, MD  diphenhydrAMINE (BENADRYL) 25 mg capsule Take 25 mg by mouth at bedtime as needed for allergies or sleep (sleep).    Yes Historical Provider, MD  finasteride (PROSCAR) 5 MG tablet Take 5 mg by mouth daily.   Yes Historical Provider, MD  fluticasone (FLONASE) 50 MCG/ACT nasal spray Place 2 sprays into both nostrils daily.   Yes Historical Provider, MD  ibuprofen (ADVIL,MOTRIN) 200 MG tablet Take 600 mg by mouth every 6 (six) hours as needed (fever).   Yes Historical Provider, MD  loratadine-pseudoephedrine (CLARITIN-D 12-HOUR) 5-120 MG per tablet Take 1 tablet by mouth 2 (two) times daily.   Yes Historical Provider, MD  MAGNESIUM PO Take 1 tablet by mouth daily.   Yes Historical Provider, MD  tamsulosin (FLOMAX) 0.4 MG CAPS capsule Take 1 capsule (0.4 mg total) by mouth daily. 06/25/14  Yes Willie Bucy, MD  HYDROcodone-acetaminophen (NORCO/VICODIN) 5-325 MG per  tablet Take 1 tablet by mouth every 6 (six) hours as needed for moderate pain. 06/25/14   Willie Bucy, MD   Physical Exam: Filed Vitals:   10/25/14 2150  BP: 114/70  Pulse: 111  Temp: 98.6 F (37 C)  TempSrc: Oral  Resp: 20  SpO2: 95%    Wt Readings from Last 3 Encounters:  06/19/13 104.327 kg (230 lb)    General:  Appears calm and comfortable Eyes:  PERRL, normal lids, irises & conjunctiva ENT:  grossly normal hearing, lips & tongue Neck:  no LAD, masses or  thyromegaly Cardiovascular:  RRR, no m/r/g. No LE edema. Telemetry:  SR, no arrhythmias  Respiratory:  CTA bilaterally, no w/r/r. Normal respiratory effort. Abdomen:  soft, ntnd Skin:  no rash or induration seen on limited exam Musculoskeletal:  grossly normal tone BUE/BLE Psychiatric:  grossly normal mood and affect, speech fluent and appropriate Neurologic:  grossly non-focal.          Labs on Admission:  Basic Metabolic Panel:  Recent Labs Lab 10/25/14 2357  NA 137  K 4.2  CL 100  CO2 23  GLUCOSE 116*  BUN 18  CREATININE 1.22  CALCIUM 9.1   Liver Function Tests: No results for input(s): AST, ALT, ALKPHOS, BILITOT, PROT, ALBUMIN in the last 168 hours. No results for input(s): LIPASE, AMYLASE in the last 168 hours. No results for input(s): AMMONIA in the last 168 hours. CBC:  Recent Labs Lab 10/25/14 2357  WBC 23.1*  NEUTROABS 18.4*  HGB 14.1  HCT 40.9  MCV 88.0  PLT 249   Cardiac Enzymes: No results for input(s): CKTOTAL, CKMB, CKMBINDEX, TROPONINI in the last 168 hours.  BNP (last 3 results) No results for input(s): PROBNP in the last 8760 hours. CBG: No results for input(s): GLUCAP in the last 168 hours.  Radiological Exams on Admission: No results found.  EKG: Independently reviewed. None performed  Assessment/Plan Active Problems:   Pyelonephritis   OSA on CPAP   GERD (gastroesophageal reflux disease)   Congenital malformation of kidney  Pyelonephritis: UTI based on UA w/ WBC 23, and febrile. H/o recent instrumentation at urology. 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 R kidney nml. Levaquin in ED - continue home proscar and flomax - f/u [facility] - Willie Franco - continue levaquin IV - COnsider ABD CT if symptoms persist as there is concern for L ureter abnormalities and cyst harboring infection that may need more intensive treatment - trend BMET.  - NS 159ml/hr.   OSA:  - continue home CPAP  GERD:  - PPI  Code Status: FULL  DVT  Prophylaxis: hep Family Communication: wife  Disposition Plan: pending improvement - Obs  Time spent: > 70 min  Willie Franco, Willie Franco [email or web address] Password [email or web address]

## 2014-10-26 NOTE — Plan of Care (Signed)
Problem: Phase I Progression Outcomes Goal: Pain controlled with appropriate interventions Outcome: Completed/Met Date Met:  10/26/14 Goal: OOB as tolerated unless otherwise ordered Outcome: Completed/Met Date Met:  10/26/14 Goal: Vital Signs stable- temperature less than 102 Outcome: Completed/Met Date Met:  10/26/14 Goal: Initial discharge plan identified Outcome: Completed/Met Date Met:  10/26/14 Goal: Voiding-avoid urinary catheter unless indicated Outcome: Completed/Met Date Met:  10/26/14 Goal: Tolerating diet Outcome: Completed/Met Date Met:  10/26/14

## 2014-10-26 NOTE — Progress Notes (Addendum)
Pt stated that he would self administer CPAP when ready for bed.  Current settings are Auto CPAP 7-20 CMH20 via [facility] per pt home settings.  RT to monitor and assess as needed.

## 2014-10-26 NOTE — Progress Notes (Signed)
ANTIBIOTIC CONSULT NOTE - INITIAL  Pharmacy Consult for Vancomycin Indication: UTI s/p instrumentation  No Known Allergies  Patient Measurements: Height: 5' 10.5" (179.1 cm) Weight: 221 lb (100.245 kg) IBW/kg (Calculated) : 74.15   Vital Signs: Temp: 98.8 F (37.1 C) (11/24 0630) Temp Source: Oral (11/24 0630) BP: 127/63 mmHg (11/24 0630) Pulse Rate: 89 (11/24 0630) Intake/Output from previous day: 11/23 0701 - 11/24 0700 In: 208.3 [I.V.:58.3; IV Piggyback:150] Out: -  Intake/Output from this shift:    Labs:  Recent Labs  10/25/14 2357 10/26/14 0241  WBC 23.1* 23.1*  HGB 14.1 13.6  PLT 249 234  CREATININE 1.22 1.21   Estimated Creatinine Clearance: 75.7 mL/min (by C-G formula based on Cr of 1.21). No results for input(s): VANCOTROUGH, VANCOPEAK, VANCORANDOM, GENTTROUGH, GENTPEAK, GENTRANDOM, TOBRATROUGH, TOBRAPEAK, TOBRARND, AMIKACINPEAK, AMIKACINTROU, AMIKACIN in the last 72 hours.   Microbiology: No results found for this or any previous visit (from the past 720 hour(s)).  Medical History: Past Medical History  Diagnosis Date  . Sleep apnea     no CPAP at present  . Arthritis   . GERD (gastroesophageal reflux disease)   . Renal disorder     only has rt kidney    Medications:  Scheduled:  . finasteride  5 mg Oral Daily  . heparin  5,000 Units Subcutaneous 3 times per day  . levofloxacin (LEVAQUIN) IV  750 mg Intravenous Q24H  . pantoprazole  40 mg Oral Daily  . tamsulosin  0.4 mg Oral Daily  . vancomycin  1,000 mg Intravenous Q12H   Assessment: 63 y.o. male with hematuria, dysuria, and fevers. Seen by urology last week for hematuria. CT scan and cystoscope at that time were nml. Acutely developed dysuria and cloudy urine on 11/23. Fevers at home to 102.4. H/o congenital single kidney. Concern for L ureter abnormalities and cyst harboring infection. MD dosing levaquin, pharmacy consulted to dose vanc.   Goal of Therapy:  Vancomycin trough level  10-15 mcg/ml  Plan:   Vancomycin 1gm IV q12h  Follow renal function/ cultures/clinical course  vanc levels as needed  Dolly Rias RPh 10/26/2014, 8:52 AM Pager [phone]

## 2014-10-27 LAB — CBC
HCT: 39.6 % (ref 39.0–52.0)
Hemoglobin: 13.2 g/dL (ref 13.0–17.0)
MCH: 29.5 pg (ref 26.0–34.0)
MCHC: 33.3 g/dL (ref 30.0–36.0)
MCV: 88.6 fL (ref 78.0–100.0)
PLATELETS: 212 10*3/uL (ref 150–400)
RBC: 4.47 MIL/uL (ref 4.22–5.81)
RDW: 12.4 % (ref 11.5–15.5)
WBC: 17 10*3/uL — AB (ref 4.0–10.5)

## 2014-10-27 LAB — BASIC METABOLIC PANEL WITH GFR
Anion gap: 12 (ref 5–15)
BUN: 12 mg/dL (ref 6–23)
CO2: 23 meq/L (ref 19–32)
Calcium: 8.5 mg/dL (ref 8.4–10.5)
Chloride: 100 meq/L (ref 96–112)
Creatinine, Ser: 1.31 mg/dL (ref 0.50–1.35)
GFR calc Af Amer: 66 mL/min — ABNORMAL LOW
GFR calc non Af Amer: 57 mL/min — ABNORMAL LOW
Glucose, Bld: 134 mg/dL — ABNORMAL HIGH (ref 70–99)
Potassium: 4.5 meq/L (ref 3.7–5.3)
Sodium: 135 meq/L — ABNORMAL LOW (ref 137–147)

## 2014-10-27 LAB — URINE CULTURE: Colony Count: 6000

## 2014-10-27 NOTE — Progress Notes (Signed)
Pt stated that he would self administer CPAP when ready for bed.  Current settings are Auto CPAP 7-20 CMH20 via [facility].  Pt to notify RT if any complications should arise.  RT to monitor and assess as needed.

## 2014-10-27 NOTE — Progress Notes (Signed)
PROGRESS NOTE  Willie Franco ZJI:[id number] DOB: Oct 24, 1951 DOA: 10/25/2014 PCP: Yong Channel, MD  Summary: 63 year old male presented with dysuria, hematuria and fever. Saw urology 1 week prior to admission for hematuria, CT scan cystoscope at that time reportedly normal. After instrumentation developed dysuria and fever. Admitted for acute pyelonephritis, UTI likely related to recent instrumentation.  According to record review CT abdomen and pelvis 11/20: Severe chronic atrophy of the left kidney, normal appearance of the right kidney and right ureter, abnormal appearance of the prostate.  Assessment/Plan: 1. UTI, pyelonephritis. Febrile but otherwise dramatically improved. Leukocytosis trending downwards. Urine clearing. Culture was unrevealing unfortunately. Given his clinical improvement plan to continue current antibiotics. Fever not unexpected in pyelonephritis. 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  2. Hematuria, according to urology note, evaluation was negative, plans were for follow-up urinalysis in 6 months. Hematuria was felt to be related to BPH. Plans were treatment with finasteride and tamsulosin.  3. Obstructive sleep apnea   Overall improving. Plan to continue empiric antibiotics. Repeat CBC and basic metabolic panel in the morning. Anticipate afebrile within the next 24-48 hours with antibiotics. Once afebrile for greater than 24 hours, likely home. No signs of complicating features at this point, symptomatically he is improving.  Discussed in detail, reviewed imaging and laboratory findings with his wife at bedside.  Code Status: full code DVT prophylaxis: heparin Family Communication:  Disposition Plan:   Murray Hodgkins, MD  [facility]  Pager [phone] If 7PM-7AM, please contact night-coverage at [email or web address], password [facility] 10/27/2014, 3:01 PM  LOS: 2 days    Consultants:    Procedures:    Antibiotics:  Vancomycin 11/24  >>  Levaquin 11/24 >>  HPI/Subjective: Feels much better today, as long as he does not have fever. Left flank pain has completely resolved. Urine has cleared up dramatically. No abdominal pain nausea or vomiting.  Objective: Filed Vitals:   10/27/14 0530 10/27/14 0837 10/27/14 1207 10/27/14 1325  BP: 140/69   130/71  Pulse: 96   90  Temp: 100.1 F (37.8 C) 98.1 F (36.7 C) 98.7 F (37.1 C) 101.6 F (38.7 C)  TempSrc: Oral Oral Oral Rectal  Resp: 18   20  Height:      Weight:      SpO2: 92%   95%    Intake/Output Summary (Last 24 hours) at 10/27/14 1501 Last data filed at 10/27/14 0813  Gross per 24 hour  Intake 2601.67 ml  Output   1095 ml  Net 1506.67 ml     Filed Weights   10/26/14 0200  Weight: 100.245 kg (221 lb)    Exam:     Tm 102. Vitals stable. No hypoxia.   General: Appears calm, comfortable. Nontoxic.  Psych: Alert. Speech fluent and clear.  CV: Regular rate and rhythm. No murmur, rub or gallop.  Respiratory: Clear to auscultation bilaterally. No wheezes, rales or rhonchi. Normal respiratory effort.  Abdomen: Soft, nontender, nondistended. No left flank pain.  Data Reviewed:  Urine output 1295  BUN 12, creatinine 1.31. Sodium 135.  WBC 23.1 >> 17.0   Blood cultures pending. Urine culture with insignificant growth.2  Scheduled Meds: . finasteride  5 mg Oral Daily  . heparin  5,000 Units Subcutaneous 3 times per day  . levofloxacin (LEVAQUIN) IV  750 mg Intravenous Q24H  . pantoprazole  40 mg Oral Daily  . tamsulosin  0.4 mg Oral Daily  . vancomycin  1,000 mg Intravenous  Q12H   Continuous Infusions: . sodium chloride 100 mL/hr at 10/27/14 6438    Principal Problem:   Pyelonephritis Active Problems:   OSA on CPAP   GERD (gastroesophageal reflux disease)   Congenital malformation of kidney   Time spent 35 minutes, greater than 50% in counseling and  coordination of care

## 2014-10-28 LAB — CBC
HCT: 37.5 % — ABNORMAL LOW (ref 39.0–52.0)
HEMOGLOBIN: 12.6 g/dL — AB (ref 13.0–17.0)
MCH: 29.9 pg (ref 26.0–34.0)
MCHC: 33.6 g/dL (ref 30.0–36.0)
MCV: 88.9 fL (ref 78.0–100.0)
Platelets: 211 10*3/uL (ref 150–400)
RBC: 4.22 MIL/uL (ref 4.22–5.81)
RDW: 12.6 % (ref 11.5–15.5)
WBC: 11.5 10*3/uL — ABNORMAL HIGH (ref 4.0–10.5)

## 2014-10-28 LAB — BASIC METABOLIC PANEL
Anion gap: 13 (ref 5–15)
BUN: 11 mg/dL (ref 6–23)
CHLORIDE: 99 meq/L (ref 96–112)
CO2: 23 mEq/L (ref 19–32)
CREATININE: 1.26 mg/dL (ref 0.50–1.35)
Calcium: 8.5 mg/dL (ref 8.4–10.5)
GFR calc non Af Amer: 59 mL/min — ABNORMAL LOW (ref >90)
GFR, EST AFRICAN AMERICAN: 69 mL/min — AB (ref >90)
Glucose, Bld: 114 mg/dL — ABNORMAL HIGH (ref 70–99)
POTASSIUM: 4 meq/L (ref 3.7–5.3)
Sodium: 135 mEq/L — ABNORMAL LOW (ref 137–147)

## 2014-10-28 MED ORDER — POLYETHYLENE GLYCOL 3350 17 G PO PACK
17.0000 g | PACK | Freq: Two times a day (BID) | ORAL | Status: DC
  Administered 2014-10-28 (×2): 17 g via ORAL
  Filled 2014-10-28 (×4): qty 1

## 2014-10-28 NOTE — Progress Notes (Signed)
PROGRESS NOTE  Willie Franco GOT:[id number] DOB: 1951-10-20 DOA: 10/25/2014 PCP: Yong Channel, MD  Summary: 62 year old male presented with dysuria, hematuria and fever. Saw urology 1 week prior to admission for hematuria, CT scan cystoscope at that time reportedly normal. After instrumentation developed dysuria and fever. Admitted for acute pyelonephritis, UTI likely related to recent instrumentation.  According to record review CT abdomen and pelvis 11/20: Severe chronic atrophy of the left kidney, normal appearance of the right kidney and right ureter, abnormal appearance of the prostate.  Assessment/Plan: 1. UTI, pyelonephritis. Now afebrile 24 hours. Leukocytosis now near normal.  Culture was unrevealing unfortunately. Given his clinical improvement plan to continue current antibiotics 2. 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  3. Hematuria, resolved, according to urology note, evaluation was negative, plans were for follow-up urinalysis in 6 months. Hematuria was felt to be related to BPH. Plans were treatment with finasteride and tamsulosin.  4. Obstructive sleep apnea   Continuing to improve with resolution of fever and near normalization of white blood cell count. Plan to continue current antibiotics, likely transition to oral therapy and discharge home 11/27.  Code Status: full code DVT prophylaxis: heparin Family Communication:  Disposition Plan:   Murray Hodgkins, MD  [facility]  Pager [phone] If 7PM-7AM, please contact night-coverage at [email or web address], password [facility] 10/28/2014, 12:06 PM  LOS: 3 days   Consultants:    Procedures:    Antibiotics:  Vancomycin 11/24  >>  Levaquin 11/24 >>  HPI/Subjective: Feeling better, no pain, no n/v. No fever since yesterday. Had BM today.  Objective: Filed Vitals:   10/27/14 1922 10/27/14 2115 10/28/14 0459 10/28/14 0824  BP:  129/68  133/73   Pulse:  90 68   Temp: 99.1 F (37.3 C) 98.7 F (37.1 C) 99.1 F (37.3 C) 98.1 F (36.7 C)  TempSrc: Oral Oral Oral Oral  Resp:  18 18   Height:      Weight:      SpO2:  98% 98%     Intake/Output Summary (Last 24 hours) at 10/28/14 1206 Last data filed at 10/28/14 8453  Gross per 24 hour  Intake 3651.67 ml  Output   1100 ml  Net 2551.67 ml     Filed Weights   10/26/14 0200  Weight: 100.245 kg (221 lb)    Exam:     Afebrile now nearly 24 hours. Vital signs stable.  Gen. Appears calm, comfortable. Alert, speech fluent and clear.  Respiratory clear to auscultation bilaterally. No wheezes, rales or rhonchi. Normal respiratory effort.  No flank pain.  Cardiovascular regular rate and rhythm. No murmur, rub or gallop.  Data Reviewed:  Urine output 1300  BUN and creatinine stable. Sodium stable 135.  WBC 23.1 >> 17.0  >> 11.5.  Blood cultures pending. Urine culture with insignificant growth.  Scheduled Meds: . finasteride  5 mg Oral Daily  . heparin  5,000 Units Subcutaneous 3 times per day  . levofloxacin (LEVAQUIN) IV  750 mg Intravenous Q24H  . pantoprazole  40 mg Oral Daily  . polyethylene glycol  17 g Oral BID  . tamsulosin  0.4 mg Oral Daily  . vancomycin  1,000 mg Intravenous Q12H   Continuous Infusions: . sodium chloride 100 mL/hr at 10/28/14 0845    Principal Problem:   Pyelonephritis Active Problems:   OSA on CPAP   GERD (gastroesophageal reflux disease)   Congenital malformation of kidney   Time spent 15 minutes

## 2014-10-29 LAB — BASIC METABOLIC PANEL
Anion gap: 11 (ref 5–15)
BUN: 12 mg/dL (ref 6–23)
CO2: 26 mEq/L (ref 19–32)
Calcium: 8.7 mg/dL (ref 8.4–10.5)
Chloride: 101 mEq/L (ref 96–112)
Creatinine, Ser: 1.17 mg/dL (ref 0.50–1.35)
GFR calc Af Amer: 75 mL/min — ABNORMAL LOW (ref >90)
GFR, EST NON AFRICAN AMERICAN: 65 mL/min — AB (ref >90)
GLUCOSE: 126 mg/dL — AB (ref 70–99)
Potassium: 4 mEq/L (ref 3.7–5.3)
SODIUM: 138 meq/L (ref 137–147)

## 2014-10-29 LAB — CBC
HCT: 38.1 % — ABNORMAL LOW (ref 39.0–52.0)
Hemoglobin: 12.7 g/dL — ABNORMAL LOW (ref 13.0–17.0)
MCH: 29.4 pg (ref 26.0–34.0)
MCHC: 33.3 g/dL (ref 30.0–36.0)
MCV: 88.2 fL (ref 78.0–100.0)
PLATELETS: 220 10*3/uL (ref 150–400)
RBC: 4.32 MIL/uL (ref 4.22–5.81)
RDW: 12.3 % (ref 11.5–15.5)
WBC: 8.6 10*3/uL (ref 4.0–10.5)

## 2014-10-29 MED ORDER — LEVOFLOXACIN 750 MG PO TABS: 750.0000 mg | ORAL_TABLET | Freq: Every day | ORAL | Status: DC

## 2014-10-29 MED ORDER — LEVOFLOXACIN 750 MG PO TABS
750.0000 mg | ORAL_TABLET | Freq: Every day | ORAL | Status: DC
  Filled 2014-10-29: qty 1

## 2014-10-29 MED ORDER — LORATADINE-PSEUDOEPHEDRINE ER 5-120 MG PO TB12: 1.0000 | ORAL_TABLET | Freq: Two times a day (BID) | ORAL | Status: DC | PRN

## 2014-10-29 NOTE — Progress Notes (Signed)
  PROGRESS NOTE  REZNOR FERRANDO WUG:[id number] DOB: [birth year] DOA: 10/25/2014 PCP: Yong Channel, MD  Summary: 63 year old male presented with dysuria, hematuria and fever. Saw urology 1 week prior to admission for hematuria, CT scan cystoscope at that time reportedly normal. After instrumentation developed dysuria and fever. Admitted for acute pyelonephritis, UTI likely related to recent instrumentation.   Assessment/Plan: 1. UTI, pyelonephritis. Now afebrile 48 hours. Leukocytosis  resolved.  Culture was unrevealing unfortunately. * 2. 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  3. Hematuria, resolved, according to urology note, evaluation was negative, plans were for follow-up urinalysis in 6 months. Hematuria was felt to be related to BPH. Plans were treatment with finasteride and tamsulosin.  4. Obstructive sleep apnea   Doing very well.  Discharge home today on Levaquin.  Discussed with wife at bedside  Murray Hodgkins, MD  [facility]  Pager [phone] If 7PM-7AM, please contact night-coverage at [email or web address], password [facility] 10/29/2014, 10:48 AM  LOS: 4 days   Consultants:    Procedures:    Antibiotics:  Vancomycin 11/24  >> 11/27  Levaquin 11/24 >> 12/3  HPI/Subjective: Much improved today. He is eating well. No flank pain. Bowels moving. No complaints. Wants to go home.  Objective: Filed Vitals:   10/28/14 2145 10/28/14 2210 10/28/14 2212 10/29/14 0554  BP: 116/63  116/68 147/79  Pulse:  77 86 70  Temp: 99.2 F (37.3 C)  98.1 F (36.7 C) 98.1 F (36.7 C)  TempSrc: Oral  Oral Oral  Resp: 18 16 16 18   Height:      Weight:      SpO2: 97% 95% 99% 96%    Intake/Output Summary (Last 24 hours) at 10/29/14 1048 Last data filed at 10/29/14 0555  Gross per 24 hour  Intake      0 ml  Output   2100 ml  Net  -2100 ml     Filed Weights   10/26/14 0200  Weight: 100.245 kg  (221 lb)    Exam:     Afebrile, vital signs are stable. No hypoxia.  Alert. Speech fluent and clear. Appears calm and comfortable.  Respiratory. Clear to auscultation bilaterally. No wheezes, rales or rhonchi. Normal respiratory effort.  Cardiovascular regular rate and rhythm. No murmur, rub or gallop. No lower extremity edema.  Flank without pain.  Data Reviewed:  Urine output 3491  Basic metabolic panel unremarkable.  WBC is normalized. Hemoglobin stable 12.7.  Scheduled Meds: . finasteride  5 mg Oral Daily  . heparin  5,000 Units Subcutaneous 3 times per day  . levofloxacin (LEVAQUIN) IV  750 mg Intravenous Q24H  . pantoprazole  40 mg Oral Daily  . polyethylene glycol  17 g Oral BID  . tamsulosin  0.4 mg Oral Daily  . vancomycin  1,000 mg Intravenous Q12H   Continuous Infusions: . sodium chloride 75 mL/hr at 10/28/14 2036    Principal Problem:   Pyelonephritis Active Problems:   OSA on CPAP   GERD (gastroesophageal reflux disease)   Congenital malformation of kidney

## 2014-10-29 NOTE — Progress Notes (Signed)
Patient alert and oriented, discharge instructions given, patient and wife verbalize understanding of discharge instructions given, patient declined My Chart access at this time, will access at home, patient in stable condition at this time

## 2014-10-29 NOTE — Progress Notes (Signed)
CARE MANAGEMENT NOTE 10/29/2014  Patient:  Willie Franco, Willie Franco   Account Number:  [id number]  Date Initiated:  10/29/2014  Documentation initiated by:  Edwyna Shell  Subjective/Objective Assessment:   63 yo male admitted with pyelonephritis from home     Action/Plan:   discharge planning   Anticipated DC Date:  10/30/2014   Anticipated DC Plan:  [place]  CM consult      Choice offered to / List presented to:             Status of service:  Completed, signed off Medicare Important Message given?   (If response is "NO", the following Medicare IM given date fields will be blank) Date Medicare IM given:   Medicare IM given by:   Date Additional Medicare IM given:   Additional Medicare IM given by:    Discharge Disposition:  HOME/SELF CARE  Per UR Regulation:    If discussed at Long Length of Stay Meetings, dates discussed:    Comments:  10/29/14 Edwyna Shell RN [facility] CM [id number] No orders, no needs identified

## 2014-10-29 NOTE — Discharge Summary (Addendum)
Physician Discharge Summary  Willie Franco SNK:[id number] DOB: May 16, 1951 DOA: 10/25/2014  PCP: Yong Channel, MD  Admit date: 10/25/2014 Discharge date: 10/29/2014  Recommendations for Outpatient Follow-up:  1. Resolution of UTI, pyelonephritis.    Follow-up Information    Follow up with CABEZA,YURI, MD.   Specialty:  Internal Medicine   Contact information:   [address] [place] [address] [phone]      Discharge Diagnoses:  1. Left pyelonephritis, UTI 2. Possible sepsis on admission related to UTI post urologic procedure  Discharge Condition: Improved. Disposition: Home.  Diet recommendation: Regular  Filed Weights   10/26/14 0200  Weight: 100.245 kg (221 lb)    History of present illness:  63 year old male presented with dysuria, hematuria and fever. Saw urology 1 week prior to admission for hematuria, CT scan cystoscope at that time reportedly normal. After instrumentation developed dysuria and fever. Admitted for acute pyelonephritis, UTI likely related to recent instrumentation.   Hospital Course:  Rapidly improved with empiric antibiotics, hospitalization was uncomplicated, at time of discharge leukocytosis had resolved, afebrile for 48 hours.  1. UTI, pyelonephritis. Now afebrile 48 hours. Leukocytosis resolved. Culture was unrevealing unfortunately.  2. 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  3. Hematuria, resolved, according to urology note, evaluation was negative, plans were for follow-up urinalysis in 6 months. Hematuria was felt to be related to BPH. Plans were treatment with finasteride and tamsulosin.   Consultants:  none  Procedures:  none  Antibiotics:  Vancomycin 11/24 >> 11/27  Levaquin 11/24 >> 12/3  Discharge Instructions  Discharge Instructions    Diet general    Complete by:  As directed      Discharge instructions     Complete by:  As directed   Call your physician or seek immediate medical attention for fever, flank pain, vomiting or worsening of condition.     Increase activity slowly    Complete by:  As directed           Discharge Medication List as of 10/29/2014  1:41 PM    START taking these medications   Details  levofloxacin (LEVAQUIN) 750 MG tablet Take 1 tablet (750 mg total) by mouth at bedtime., Starting 10/29/2014, Until Discontinued, Normal      CONTINUE these medications which have CHANGED   Details  loratadine-pseudoephedrine (CLARITIN-D 12-HOUR) 5-120 MG per tablet Take 1 tablet by mouth 2 (two) times daily as needed for allergies., Starting 10/29/2014, Until Discontinued, No Print      CONTINUE these medications which have NOT CHANGED   Details  acetaminophen (TYLENOL) 500 MG tablet Take 1,000 mg by mouth every 6 (six) hours as needed for fever (fever)., Until Discontinued, Historical Med    diphenhydrAMINE (BENADRYL) 25 mg capsule Take 25 mg by mouth at bedtime as needed for allergies or sleep (sleep). , Until Discontinued, Historical Med    finasteride (PROSCAR) 5 MG tablet Take 5 mg by mouth daily., Until Discontinued, Historical Med    fluticasone (FLONASE) 50 MCG/ACT nasal spray Place 2 sprays into both nostrils daily., Until Discontinued, Historical Med    ibuprofen (ADVIL,MOTRIN) 200 MG tablet Take 600 mg by mouth every 6 (six) hours as needed (fever)., Until Discontinued, Historical Med    tamsulosin (FLOMAX) 0.4 MG CAPS capsule Take 1 capsule (0.4 mg total) by mouth daily., Starting 06/25/2014, Until Discontinued, Print      STOP taking these medications  Chromium Picolinate 800 MCG TABS      MAGNESIUM PO      HYDROcodone-acetaminophen (NORCO/VICODIN) 5-325 MG per tablet        No Known Allergies  The results of significant diagnostics from this hospitalization (including imaging, microbiology, ancillary and laboratory) are listed below for reference.     Significant Diagnostic Studies: No results found.  Microbiology: Recent Results (from the past 240 hour(s))  Urine culture     Status: None   Collection Time: 10/26/14 12:21 AM  Result Value Ref Range Status   Specimen Description URINE, CLEAN CATCH  Final   Special Requests NONE  Final   Culture  Setup Time   Final    10/26/2014 06:19 Performed at [facility]   Final    6,000 COLONIES/ML Performed at [facility]    Culture   Final    INSIGNIFICANT GROWTH Performed at [facility]    Report Status 10/27/2014 FINAL  Final  Culture, blood (routine x 2)     Status: None (Preliminary result)   Collection Time: 10/26/14  2:10 AM  Result Value Ref Range Status   Specimen Description BLOOD RIGHT ARM  Final   Special Requests BOTTLES DRAWN AEROBIC AND ANAEROBIC 5CC  Final   Culture  Setup Time   Final    10/26/2014 10:36 Performed at [facility]    Culture   Final           BLOOD CULTURE RECEIVED NO GROWTH TO DATE CULTURE WILL BE HELD FOR 5 DAYS BEFORE ISSUING A FINAL NEGATIVE REPORT Performed at [facility]    Report Status PENDING  Incomplete  Culture, blood (routine x 2)     Status: None (Preliminary result)   Collection Time: 10/26/14  2:41 AM  Result Value Ref Range Status   Specimen Description BLOOD RIGHT ANTECUBITAL  Final   Special Requests BOTTLES DRAWN AEROBIC AND ANAEROBIC 8CC  Final   Culture  Setup Time   Final    10/26/2014 10:36 Performed at [facility]    Culture   Final           BLOOD CULTURE RECEIVED NO GROWTH TO DATE CULTURE WILL BE HELD FOR 5 DAYS BEFORE ISSUING A FINAL NEGATIVE REPORT Performed at [facility]    Report Status PENDING  Incomplete     Labs: Basic Metabolic Panel:  Recent Labs Lab 10/25/14 2357 10/26/14 0241 10/27/14 0525 10/28/14 0535 10/29/14 0425  NA 137 135* 135* 135* 138  K 4.2 4.1 4.5 4.0 4.0  CL 100 98 100 99 101  CO2 23 26 23 23 26    GLUCOSE 116* 123* 134* 114* 126*  BUN 18 18 12 11 12   CREATININE 1.22 1.21 1.31 1.26 1.17  CALCIUM 9.1 9.0 8.5 8.5 8.7   CBC:  Recent Labs Lab 10/25/14 2357 10/26/14 0241 10/27/14 0525 10/28/14 0535 10/29/14 0425  WBC 23.1* 23.1* 17.0* 11.5* 8.6  NEUTROABS 18.4*  --   --   --   --   HGB 14.1 13.6 13.2 12.6* 12.7*  HCT 40.9 40.5 39.6 37.5* 38.1*  MCV 88.0 88.4 88.6 88.9 88.2  PLT 249 234 212 211 220    Principal Problem:   Pyelonephritis Active Problems:   OSA on CPAP   GERD (gastroesophageal reflux disease)   Congenital malformation of kidney   Time coordinating discharge: 25 minutes  Signed:  Murray Hodgkins, MD [facility] 10/29/2014, 5:29 PM

## 2014-11-01 LAB — CULTURE, BLOOD (ROUTINE X 2)
CULTURE: NO GROWTH
Culture: NO GROWTH

## 2019-01-14 ENCOUNTER — Other Ambulatory Visit: Payer: Self-pay

## 2019-01-14 ENCOUNTER — Emergency Department (HOSPITAL_COMMUNITY): Payer: Federal, State, Local not specified - PPO

## 2019-01-14 ENCOUNTER — Encounter (HOSPITAL_COMMUNITY): Payer: Self-pay | Admitting: Emergency Medicine

## 2019-01-14 ENCOUNTER — Inpatient Hospital Stay (HOSPITAL_COMMUNITY)
Admission: EM | Admit: 2019-01-14 | Discharge: 2019-01-19 | DRG: 872 | Disposition: A | Discharge status: Home / Self Care | Payer: Federal, State, Local not specified - PPO | Attending: Family Medicine | Admitting: Family Medicine

## 2019-01-14 DIAGNOSIS — N12 Tubulo-interstitial nephritis, not specified as acute or chronic: Secondary | ICD-10-CM | POA: Diagnosis not present

## 2019-01-14 DIAGNOSIS — R972 Elevated prostate specific antigen [PSA]: Secondary | ICD-10-CM | POA: Diagnosis present

## 2019-01-14 DIAGNOSIS — G4733 Obstructive sleep apnea (adult) (pediatric): Secondary | ICD-10-CM | POA: Diagnosis present

## 2019-01-14 DIAGNOSIS — Z7951 Long term (current) use of inhaled steroids: Secondary | ICD-10-CM

## 2019-01-14 DIAGNOSIS — I10 Essential (primary) hypertension: Secondary | ICD-10-CM | POA: Diagnosis present

## 2019-01-14 DIAGNOSIS — E785 Hyperlipidemia, unspecified: Secondary | ICD-10-CM | POA: Diagnosis present

## 2019-01-14 DIAGNOSIS — E119 Type 2 diabetes mellitus without complications: Secondary | ICD-10-CM | POA: Diagnosis present

## 2019-01-14 DIAGNOSIS — Z9660 Presence of unspecified orthopedic joint implant: Secondary | ICD-10-CM | POA: Diagnosis present

## 2019-01-14 DIAGNOSIS — N179 Acute kidney failure, unspecified: Secondary | ICD-10-CM | POA: Diagnosis present

## 2019-01-14 DIAGNOSIS — A419 Sepsis, unspecified organism: Secondary | ICD-10-CM | POA: Diagnosis not present

## 2019-01-14 DIAGNOSIS — M1990 Unspecified osteoarthritis, unspecified site: Secondary | ICD-10-CM | POA: Diagnosis present

## 2019-01-14 DIAGNOSIS — E669 Obesity, unspecified: Secondary | ICD-10-CM | POA: Diagnosis present

## 2019-01-14 DIAGNOSIS — Q639 Congenital malformation of kidney, unspecified: Secondary | ICD-10-CM

## 2019-01-14 DIAGNOSIS — Z7984 Long term (current) use of oral hypoglycemic drugs: Secondary | ICD-10-CM

## 2019-01-14 DIAGNOSIS — Z6837 Body mass index (BMI) 37.0-37.9, adult: Secondary | ICD-10-CM

## 2019-01-14 DIAGNOSIS — N136 Pyonephrosis: Secondary | ICD-10-CM | POA: Diagnosis present

## 2019-01-14 DIAGNOSIS — Q631 Lobulated, fused and horseshoe kidney: Secondary | ICD-10-CM

## 2019-01-14 DIAGNOSIS — K219 Gastro-esophageal reflux disease without esophagitis: Secondary | ICD-10-CM | POA: Diagnosis present

## 2019-01-14 DIAGNOSIS — Z1624 Resistance to multiple antibiotics: Secondary | ICD-10-CM | POA: Diagnosis present

## 2019-01-14 DIAGNOSIS — N400 Enlarged prostate without lower urinary tract symptoms: Secondary | ICD-10-CM | POA: Diagnosis present

## 2019-01-14 DIAGNOSIS — R509 Fever, unspecified: Secondary | ICD-10-CM

## 2019-01-14 HISTORY — DX: Essential (primary) hypertension: I10

## 2019-01-14 LAB — COMPREHENSIVE METABOLIC PANEL
ALK PHOS: 71 U/L (ref 38–126)
ALT: 15 U/L (ref 0–44)
ANION GAP: 8 (ref 5–15)
AST: 18 U/L (ref 15–41)
Albumin: 3.9 g/dL (ref 3.5–5.0)
BUN: 16 mg/dL (ref 8–23)
CO2: 22 mmol/L (ref 22–32)
Calcium: 8.8 mg/dL — ABNORMAL LOW (ref 8.9–10.3)
Chloride: 106 mmol/L (ref 98–111)
Creatinine, Ser: 1.22 mg/dL (ref 0.61–1.24)
GFR calc Af Amer: >60 mL/min (ref >60)
GFR calc non Af Amer: >60 mL/min (ref >60)
GLUCOSE: 166 mg/dL — AB (ref 70–99)
Potassium: 3.9 mmol/L (ref 3.5–5.1)
SODIUM: 136 mmol/L (ref 135–145)
Total Bilirubin: 0.8 mg/dL (ref 0.3–1.2)
Total Protein: 7.1 g/dL (ref 6.5–8.1)

## 2019-01-14 LAB — CBC WITH DIFFERENTIAL/PLATELET
Abs Immature Granulocytes: 0.09 10*3/uL — ABNORMAL HIGH (ref 0.00–0.07)
BASOS ABS: 0 10*3/uL (ref 0.0–0.1)
Basophils Relative: 0 %
Eosinophils Absolute: 0.1 10*3/uL (ref 0.0–0.5)
Eosinophils Relative: 1 %
HEMATOCRIT: 42.8 % (ref 39.0–52.0)
Hemoglobin: 13.9 g/dL (ref 13.0–17.0)
Immature Granulocytes: 1 %
Lymphocytes Relative: 11 %
Lymphs Abs: 1.3 10*3/uL (ref 0.7–4.0)
MCH: 29.6 pg (ref 26.0–34.0)
MCHC: 32.5 g/dL (ref 30.0–36.0)
MCV: 91.1 fL (ref 80.0–100.0)
Monocytes Absolute: 0.9 10*3/uL (ref 0.1–1.0)
Monocytes Relative: 8 %
Neutro Abs: 9.1 10*3/uL — ABNORMAL HIGH (ref 1.7–7.7)
Neutrophils Relative %: 79 %
Platelets: 279 10*3/uL (ref 150–400)
RBC: 4.7 MIL/uL (ref 4.22–5.81)
RDW: 12.3 % (ref 11.5–15.5)
WBC: 11.5 10*3/uL — ABNORMAL HIGH (ref 4.0–10.5)
nRBC: 0 % (ref 0.0–0.2)

## 2019-01-14 LAB — URINALYSIS, ROUTINE W REFLEX MICROSCOPIC
Bilirubin Urine: NEGATIVE
GLUCOSE, UA: NEGATIVE mg/dL
KETONES UR: NEGATIVE mg/dL
NITRITE: NEGATIVE
Protein, ur: 30 mg/dL — AB
RBC / HPF: >50 RBC/hpf — ABNORMAL HIGH (ref 0–5)
Specific Gravity, Urine: 1.023 (ref 1.005–1.030)
WBC, UA: >50 WBC/hpf — ABNORMAL HIGH (ref 0–5)
pH: 5 (ref 5.0–8.0)

## 2019-01-14 LAB — LACTIC ACID, PLASMA: Lactic Acid, Venous: 1.5 mmol/L (ref 0.5–1.9)

## 2019-01-14 MED ORDER — SODIUM CHLORIDE 0.9% FLUSH
3.0000 mL | Freq: Once | INTRAVENOUS | Status: AC
  Administered 2019-01-14: 3 mL via INTRAVENOUS

## 2019-01-14 MED ORDER — SODIUM CHLORIDE 0.9 % IV SOLN
1.0000 g | Freq: Once | INTRAVENOUS | Status: AC
  Administered 2019-01-15: 1 g via INTRAVENOUS
  Filled 2019-01-14: qty 10

## 2019-01-14 MED ORDER — ACETAMINOPHEN 325 MG PO TABS
650.0000 mg | ORAL_TABLET | Freq: Once | ORAL | Status: AC | PRN
  Administered 2019-01-14: 650 mg via ORAL
  Filled 2019-01-14: qty 2

## 2019-01-14 MED ORDER — SODIUM CHLORIDE 0.9 % IV BOLUS
1000.0000 mL | Freq: Once | INTRAVENOUS | Status: AC
  Administered 2019-01-14: 1000 mL via INTRAVENOUS

## 2019-01-14 MED ORDER — IOPAMIDOL (ISOVUE-300) INJECTION 61%
INTRAVENOUS | Status: AC
  Filled 2019-01-14: qty 100

## 2019-01-14 MED ORDER — SODIUM CHLORIDE (PF) 0.9 % IJ SOLN
INTRAMUSCULAR | Status: AC
  Filled 2019-01-14: qty 50

## 2019-01-14 MED ORDER — IOPAMIDOL (ISOVUE-300) INJECTION 61%
100.0000 mL | Freq: Once | INTRAVENOUS | Status: AC | PRN
  Administered 2019-01-14: 100 mL via INTRAVENOUS

## 2019-01-14 NOTE — ED Triage Notes (Signed)
Pt states that he began having fever last night.  States that he has had LLQ aching x 7 days with lt flank ache.  Denies dysuria.  Denies diarrhea.  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  Pt denies vomiting.  No cold or flu sx.  Temp 103.1 in triage.  Last ibuprofen at 2pm today.

## 2019-01-14 NOTE — ED Provider Notes (Signed)
[facility] DEPT Provider Note   CSN: [id number] Arrival date & time: 01/14/19  2058     History   Chief Complaint Chief Complaint  Patient presents with  . Abdominal Pain  . Fever    HPI Willie Franco is a 68 y.o. male.  The history is provided by the patient and medical records.  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  States he has been having some left lower abdominal discomfort for about a week now.  He saw his PCP last week for physical and had initially plan for CT scan, however had labs done and they were reassuring so they decided to hold off.  That last night he developed fever, temp 103F on arrival here.  States it is more of a "discomfort" in his left lower abdomen.  Wife reports a few days ago he was having some left flank pain but he denies this currently.  He has not had any difficulty urinating, gross hematuria, dysuria, or malodorous urine.  He did have a bowel movement earlier today that was normal.  He has not had any nausea or vomiting.  Due for repeat colonoscopy this year-- last one was grossly normal per their recall.  He does follow-up with urology regularly, Dr. Rosana Hoes.  He has been taking motrin at home for fever.  Last dose around 2pm.  Past Medical History:  Diagnosis Date  . Arthritis   . GERD (gastroesophageal reflux disease)   . Hypertension   . 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 Sleep apnea    no CPAP at present    Patient Active Problem List   Diagnosis Date Noted  . Pyelonephritis 10/26/2014  . OSA on CPAP 10/26/2014  . GERD (gastroesophageal reflux disease) 10/26/2014  . Congenital malformation of kidney 10/26/2014    Past Surgical History:  Procedure Laterality Date  . COLONOSCOPY WITH PROPOFOL N/A  07/14/2013   Procedure: COLONOSCOPY WITH PROPOFOL;  Surgeon: Garlan Fair, MD;  Location: [facility];  Service: Endoscopy;  Laterality: N/A;  . JOINT REPLACEMENT    . TONSILLECTOMY     age 78  . 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 Authorizing Provider  acetaminophen (TYLENOL) 500 MG tablet Take 1,000 mg by mouth every 6 (six) hours as needed for fever (fever).    [provider]  diphenhydrAMINE (BENADRYL) 25 mg capsule Take 25 mg by mouth at bedtime as needed for allergies or sleep (sleep).     [provider]  finasteride (PROSCAR) 5 MG tablet Take 5 mg by mouth daily.    [provider]  fluticasone (FLONASE) 50 MCG/ACT nasal spray Place 2 sprays into both nostrils daily.    [provider]  ibuprofen (ADVIL,MOTRIN) 200 MG tablet Take 600 mg by mouth every 6 (six) hours as needed (fever).    [provider]  levofloxacin (LEVAQUIN) 750 MG tablet Take 1 tablet (750 mg total) by mouth at bedtime. 10/29/14   Samuella Cota, MD  loratadine-pseudoephedrine (CLARITIN-D 12-HOUR) 5-120 MG per tablet Take 1 tablet by mouth 2 (two) times daily as needed for allergies. 10/29/14  Samuella Cota, MD  tamsulosin (FLOMAX) 0.4 MG CAPS capsule Take 1 capsule (0.4 mg total) by mouth daily. 06/25/14   Evelina Bucy, MD    Family History No family history on file.  Social History Social History   Tobacco Use  . Smoking status: Never Smoker  . Smokeless tobacco: Never Used  Substance Use Topics  . Alcohol use: No  . Drug use: No     Allergies   Patient has no known allergies.   Review of Systems Review of Systems  Constitutional: Positive for fever.  Gastrointestinal: Positive for abdominal pain.  All other systems reviewed and are negative.    Physical Exam Updated Vital Signs BP (!) 124/96 (BP Location: Left Arm)    Pulse 100   Temp (!) 103.1 F (39.5 C) (Oral)   Resp (!) 28   Ht 5\' 10"  (1.778 m)   Wt 118 kg   SpO2 95%   BMI 37.33 kg/m   Physical Exam Vitals signs and nursing note reviewed.  Constitutional:      Appearance: He is well-developed.     Comments: Warm to the touch  HENT:     Head: Normocephalic and atraumatic.  Eyes:     Conjunctiva/sclera: Conjunctivae normal.     Pupils: Pupils are equal, round, and reactive to light.  Neck:     Musculoskeletal: Normal range of motion.  Cardiovascular:     Rate and Rhythm: Normal rate and regular rhythm.     Heart sounds: Normal heart sounds.  Pulmonary:     Effort: Pulmonary effort is normal.     Breath sounds: Normal breath sounds.  Abdominal:     General: Bowel sounds are normal.     Palpations: Abdomen is soft.     Tenderness: There is no abdominal tenderness.     Comments: No significant tenderness of abdomen or flank; reports some "discomfort" in LLQ; no peritoneal signs  Musculoskeletal: Normal range of motion.  Skin:    General: Skin is warm and dry.  Neurological:     Mental Status: He is alert and oriented to person, place, and time.      ED Treatments / Results  Labs (all labs ordered are listed, but only abnormal results are displayed) Labs Reviewed  COMPREHENSIVE METABOLIC PANEL - Abnormal; Notable for the following components:      Result Value   Glucose, Bld 166 (*)    Calcium 8.8 (*)    All other components within normal limits  CBC WITH DIFFERENTIAL/PLATELET - Abnormal; Notable for the following components:   WBC 11.5 (*)    Neutro Abs 9.1 (*)    Abs Immature Granulocytes 0.09 (*)    All other components within normal limits  URINALYSIS, ROUTINE W REFLEX MICROSCOPIC - Abnormal; Notable for the following components:   APPearance CLOUDY (*)    Hgb urine dipstick LARGE (*)    Protein, ur 30 (*)    Leukocytes,Ua LARGE (*)    RBC / HPF >50 (*)    WBC, UA >50 (*)    Bacteria, UA RARE (*)    Non Squamous  Epithelial 0-5 (*)    All other components within normal limits  URINE CULTURE  CULTURE, BLOOD (ROUTINE X 2)  CULTURE, BLOOD (ROUTINE X 2)  URINE CULTURE  LACTIC ACID, PLASMA    EKG None  Radiology Dg Chest 2 View  Result Date: 01/14/2019 CLINICAL DATA:  Fevers EXAM: CHEST - 2 VIEW COMPARISON:  None. FINDINGS: The heart size  and mediastinal contours are within normal limits. Both lungs are clear. The visualized skeletal structures are unremarkable. IMPRESSION: No active cardiopulmonary disease. Electronically Signed   By: Inez Catalina M.D.   On: 01/14/2019 21:47   Ct Abdomen Pelvis W Contrast  Result Date: 01/14/2019 CLINICAL DATA:  Fevers and left lower quadrant pain EXAM: CT ABDOMEN AND PELVIS WITH CONTRAST TECHNIQUE: Multidetector CT imaging of the abdomen and pelvis was performed using the standard protocol following bolus administration of intravenous contrast. CONTRAST:  177mL ISOVUE-300 IOPAMIDOL (ISOVUE-300) INJECTION 61% COMPARISON:  01/25/2017 FINDINGS: Lower chest: No acute abnormality. Hepatobiliary: The liver demonstrates a large right lobe cyst stable from the previous exam. The gallbladder is decompressed. Pancreas: Unremarkable. No pancreatic ductal dilatation or surrounding inflammatory changes. Spleen: Normal in size without focal abnormality. Adrenals/Urinary Tract: Adrenal glands are within normal limits. The left kidney is again atrophic with dilatation left ureter stable from the prior exam. The left ureter inserts ectopically along the inferior aspect of the bladder stable from prior exam. The bladder is partially distended. The right kidney shows mild perinephric stranding. No focal mass is seen. Fullness of the collecting system and right ureter is seen although no obstructing stone is identified. Stomach/Bowel: The appendix is within normal limits. The colon is decompressed. No small bowel abnormality is noted. The stomach is unremarkable. Vascular/Lymphatic: Aortic  atherosclerotic changes are noted. No aneurysmal dilatation is seen. Scattered small periaortic lymph nodes are identified increased in both size and number when compared with the prior exam. Some of these demonstrate normal fatty hila. Some iliac chain lymph nodes are noted. These appear more prominent than the changes in the retroperitoneum. The largest of these measures 15 mm in short axis best seen on image number 81 of series 2. Reproductive: Prostate is within normal limits. Seminal vesicles are again enlarged with some mild inflammatory change. Localized infection could not be totally excluded. Other: No abdominal wall hernia or abnormality. No abdominopelvic ascites. Musculoskeletal: Bilateral hip replacements are noted. Degenerative changes of the lumbar spine are seen. IMPRESSION: Chronic changes involving the left kidney. Mild inflammatory changes involving the seminal vesicles. These are somewhat enlarged. The size is stable from the prior CT examination although the degree of surrounding inflammatory change is new. Scattered retroperitoneal and iliac chain lymph nodes. These are new from the prior exam and of uncertain significance. They may be reactive secondary to inflammatory change although a more aggressive process such as lymphoma could not be totally excluded. Further workup would be helpful. Mild perinephric stranding involving the right kidney. No obstructing stone is seen. Possibility of underlying inflammatory change deserves consideration. Electronically Signed   By: Inez Catalina M.D.   On: 01/14/2019 23:48    Procedures Procedures (including critical care time)  Medications Ordered in ED Medications  iopamidol (ISOVUE-300) 61 % injection (has no administration in time range)  sodium chloride (PF) 0.9 % injection (has no administration in time range)  acetaminophen (TYLENOL) tablet 1,000 mg (has no administration in time range)  sodium chloride flush (NS) 0.9 % injection 3 mL (has  no administration in time range)  sodium chloride flush (NS) 0.9 % injection 3 mL (has no administration in time range)  0.9 %  sodium chloride infusion (has no administration in time range)  ondansetron (ZOFRAN) tablet 4 mg (has no administration in time range)    Or  ondansetron (ZOFRAN) injection 4 mg (has no administration in time range)  cefTRIAXone (ROCEPHIN) 2 g in sodium chloride 0.9 % 100  mL IVPB (has no administration in time range)  sodium chloride flush (NS) 0.9 % injection 3 mL (3 mLs Intravenous Given 01/14/19 2247)  acetaminophen (TYLENOL) tablet 650 mg (650 mg Oral Given 01/14/19 2218)  sodium chloride 0.9 % bolus 1,000 mL (1,000 mLs Intravenous New Bag/Given 01/14/19 2249)  iopamidol (ISOVUE-300) 61 % injection 100 mL (100 mLs Intravenous Contrast Given 01/14/19 2314)  cefTRIAXone (ROCEPHIN) 1 g in sodium chloride 0.9 % 100 mL IVPB (1 g Intravenous New Bag/Given 01/15/19 0001)     Initial Impression / Assessment and Plan / ED Course  I have reviewed the triage vital signs and the nursing notes.  Pertinent labs & imaging results that were available during my care of the patient were reviewed by me and considered in my medical decision making (see chart for details).  68 year old male here with lower abdominal discomfort for about 1 week with new fever that began yesterday.  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  Has required hospitalization for pyelonephritis in the past.  Does follow closely with urology, Dr. Rosana Hoes.  He is febrile here but overall nontoxic in appearance.  His abdomen is overall soft and benign without any peritoneal signs.  No focal CVA tenderness.  Labs are pending.  Patient had full physical with PCP last week, they were planning to get a CAT scan at that time but due to reassuring lab work this was not performed.  Given his ongoing symptoms and new fever today, will proceed with CT scan.  Patient declining pain medication at this  time.  12:02 AM Labs overall reassuring-- lactate normal, minimal leukocytosis at 11.5.  UA does appear infectious.  CT with chronic changes involving kidneys, some fullness noted of right collecting symptoms and perinephric straning .  Inflammation around seminal vesicles seen on prior CT but with increased surrounding inflammation.  There are some enlarged retroperitoneal and iliac chain lymph nodes that are new, question if this is reactive from infectious process versus more aggressive process such as lymphoma.  I discussed results with patient and wife at bedside.  I have checked his groin, do not appreciate any acute lymphadenopathy in the groin.  He denies any issues with testicle pain, abnormal ejaculate, penile discharge, etc.  Lymphadenopathy may be reactive from pyelonephritis.  Patient was started on IV Rocephin.  Will admit for ongoing care.  Discussed with Dr. Shanon Brow-- will admit for ongoing care.  Final Clinical Impressions(s) / ED Diagnoses   Final diagnoses:  Pyelonephritis  Fever, unspecified fever cause    ED Discharge Orders    None       Larene Pickett, PA-C 01/15/19 0055    Drenda Freeze, MD 01/16/19 [phone]

## 2019-01-15 ENCOUNTER — Encounter (HOSPITAL_COMMUNITY): Payer: Self-pay

## 2019-01-15 DIAGNOSIS — N12 Tubulo-interstitial nephritis, not specified as acute or chronic: Secondary | ICD-10-CM

## 2019-01-15 DIAGNOSIS — R972 Elevated prostate specific antigen [PSA]: Secondary | ICD-10-CM | POA: Diagnosis present

## 2019-01-15 DIAGNOSIS — E119 Type 2 diabetes mellitus without complications: Secondary | ICD-10-CM | POA: Diagnosis present

## 2019-01-15 DIAGNOSIS — A419 Sepsis, unspecified organism: Secondary | ICD-10-CM | POA: Diagnosis present

## 2019-01-15 DIAGNOSIS — Q631 Lobulated, fused and horseshoe kidney: Secondary | ICD-10-CM | POA: Diagnosis not present

## 2019-01-15 DIAGNOSIS — K219 Gastro-esophageal reflux disease without esophagitis: Secondary | ICD-10-CM | POA: Diagnosis present

## 2019-01-15 DIAGNOSIS — Z6837 Body mass index (BMI) 37.0-37.9, adult: Secondary | ICD-10-CM | POA: Diagnosis not present

## 2019-01-15 DIAGNOSIS — Z1624 Resistance to multiple antibiotics: Secondary | ICD-10-CM | POA: Diagnosis present

## 2019-01-15 DIAGNOSIS — M1990 Unspecified osteoarthritis, unspecified site: Secondary | ICD-10-CM | POA: Diagnosis present

## 2019-01-15 DIAGNOSIS — N136 Pyonephrosis: Secondary | ICD-10-CM | POA: Diagnosis present

## 2019-01-15 DIAGNOSIS — N400 Enlarged prostate without lower urinary tract symptoms: Secondary | ICD-10-CM | POA: Diagnosis present

## 2019-01-15 DIAGNOSIS — E669 Obesity, unspecified: Secondary | ICD-10-CM | POA: Diagnosis present

## 2019-01-15 DIAGNOSIS — E785 Hyperlipidemia, unspecified: Secondary | ICD-10-CM | POA: Diagnosis present

## 2019-01-15 DIAGNOSIS — G4733 Obstructive sleep apnea (adult) (pediatric): Secondary | ICD-10-CM | POA: Diagnosis present

## 2019-01-15 DIAGNOSIS — I10 Essential (primary) hypertension: Secondary | ICD-10-CM | POA: Diagnosis present

## 2019-01-15 DIAGNOSIS — Z9660 Presence of unspecified orthopedic joint implant: Secondary | ICD-10-CM | POA: Diagnosis present

## 2019-01-15 DIAGNOSIS — N179 Acute kidney failure, unspecified: Secondary | ICD-10-CM | POA: Diagnosis present

## 2019-01-15 DIAGNOSIS — Z7984 Long term (current) use of oral hypoglycemic drugs: Secondary | ICD-10-CM | POA: Diagnosis not present

## 2019-01-15 DIAGNOSIS — Z7951 Long term (current) use of inhaled steroids: Secondary | ICD-10-CM | POA: Diagnosis not present

## 2019-01-15 MED ORDER — ACETAMINOPHEN 500 MG PO TABS
1000.0000 mg | ORAL_TABLET | Freq: Four times a day (QID) | ORAL | Status: DC | PRN
  Administered 2019-01-15 – 2019-01-18 (×14): 1000 mg via ORAL
  Filled 2019-01-15 (×14): qty 2

## 2019-01-15 MED ORDER — ONDANSETRON HCL 4 MG/2ML IJ SOLN: 4.0000 mg | Freq: Four times a day (QID) | INTRAMUSCULAR | Status: DC | PRN

## 2019-01-15 MED ORDER — SODIUM CHLORIDE 0.9% FLUSH
3.0000 mL | Freq: Two times a day (BID) | INTRAVENOUS | Status: DC
  Administered 2019-01-15 – 2019-01-19 (×6): 3 mL via INTRAVENOUS

## 2019-01-15 MED ORDER — AMLODIPINE BESYLATE 10 MG PO TABS
10.0000 mg | ORAL_TABLET | Freq: Every day | ORAL | Status: DC
  Administered 2019-01-15 – 2019-01-19 (×5): 10 mg via ORAL
  Filled 2019-01-15 (×5): qty 1

## 2019-01-15 MED ORDER — SODIUM CHLORIDE 0.9% FLUSH: 3.0000 mL | INTRAVENOUS | Status: DC | PRN

## 2019-01-15 MED ORDER — ATORVASTATIN CALCIUM 40 MG PO TABS
80.0000 mg | ORAL_TABLET | Freq: Every day | ORAL | Status: DC
  Administered 2019-01-15 – 2019-01-19 (×5): 80 mg via ORAL
  Filled 2019-01-15 (×6): qty 2

## 2019-01-15 MED ORDER — SODIUM CHLORIDE 0.9 % IV SOLN: 250.0000 mL | INTRAVENOUS | Status: DC | PRN

## 2019-01-15 MED ORDER — SODIUM CHLORIDE 0.9 % IV SOLN
2.0000 g | INTRAVENOUS | Status: DC
  Administered 2019-01-15 – 2019-01-17 (×3): 2 g via INTRAVENOUS
  Filled 2019-01-15: qty 2
  Filled 2019-01-15: qty 20
  Filled 2019-01-15 (×2): qty 2

## 2019-01-15 MED ORDER — ONDANSETRON HCL 4 MG PO TABS: 4.0000 mg | ORAL_TABLET | Freq: Four times a day (QID) | ORAL | Status: DC | PRN

## 2019-01-15 NOTE — H&P (Signed)
History and Physical    Willie Franco VQM:[id number] DOB: 08-03-1951 DOA: 01/14/2019  PCP: Kristopher Glee., MD  Patient coming from: home  Chief Complaint:  Abdominal pain  HPI: Willie Franco is a 68 y.o. male with medical history significant of congenital horseshoe right kidney, hypertension comes in with over 1 day of right lower quadrant discomfort that radiating from his right flank.  He describes it not really as pain but just a discomfort feeling.  He started running fever last night of 101.  He has had no nausea vomiting or diarrhea.  He denies any dysuria or hematuria.  He denies any upper respiratory tract symptoms or any flulike symptoms.  He does not frequently get UTIs and is not on antibiotics frequently.  His last hospitalization that was kidney related was 5 years ago.  Patient is being referred for admission for pyelonephritis.   Review of Systems: As per HPI otherwise 10 point review of systems negative.   Past Medical History:  Diagnosis Date  . Arthritis   . GERD (gastroesophageal reflux disease)   . Hypertension   . 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 Sleep apnea    no CPAP at present    Past Surgical History:  Procedure Laterality Date  . COLONOSCOPY WITH PROPOFOL N/A 07/14/2013   Procedure: COLONOSCOPY WITH PROPOFOL;  Surgeon: Garlan Fair, MD;  Location: [facility];  Service: Endoscopy;  Laterality: N/A;  . JOINT REPLACEMENT    . TONSILLECTOMY     age 25  . 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 He has never used smokeless tobacco. He reports that he does not drink alcohol or use drugs.  No Known Allergies  No family history on file. no premature CAD  Prior to Admission medications   Medication Sig Start Date End Date Taking? Authorizing Provider  acetaminophen (TYLENOL) 500 MG tablet Take 1,000 mg by mouth every 6 (six) hours as needed for fever (fever).    [provider]  diphenhydrAMINE  (BENADRYL) 25 mg capsule Take 25 mg by mouth at bedtime as needed for allergies or sleep (sleep).     [provider]  finasteride (PROSCAR) 5 MG tablet Take 5 mg by mouth daily.    [provider]  fluticasone (FLONASE) 50 MCG/ACT nasal spray Place 2 sprays into both nostrils daily.    [provider]  ibuprofen (ADVIL,MOTRIN) 200 MG tablet Take 600 mg by mouth every 6 (six) hours as needed (fever).    [provider]  levofloxacin (LEVAQUIN) 750 MG tablet Take 1 tablet (750 mg total) by mouth at bedtime. 10/29/14   Samuella Cota, MD  loratadine-pseudoephedrine (CLARITIN-D 12-HOUR) 5-120 MG per tablet Take 1 tablet by mouth 2 (two) times daily as needed for allergies. 10/29/14   Samuella Cota, MD  tamsulosin (FLOMAX) 0.4 MG CAPS capsule Take 1 capsule (0.4 mg total) by mouth daily. 06/25/14   Evelina Bucy, MD    Physical Exam: Vitals:   01/14/19 2246 01/14/19 2350 01/14/19 2351 01/15/19 0000  BP:   130/66 129/65  Pulse:   81 84  Resp:   18   Temp: (!) 102.4 F (39.1 C) 100.3 F (37.9 C) 100.3 F (37.9 C)   TempSrc: Oral Oral Oral   SpO2:   93% 94%  Weight:      Height:  Constitutional: NAD, calm, comfortable Vitals:   01/14/19 2246 01/14/19 2350 01/14/19 2351 01/15/19 0000  BP:   130/66 129/65  Pulse:   81 84  Resp:   18   Temp: (!) 102.4 F (39.1 C) 100.3 F (37.9 C) 100.3 F (37.9 C)   TempSrc: Oral Oral Oral   SpO2:   93% 94%  Weight:      Height:       Eyes: PERRL, lids and conjunctivae normal ENMT: Mucous membranes are moist. Posterior pharynx clear of any exudate or lesions.Normal dentition.  Neck: normal, supple, no masses, no thyromegaly Respiratory: clear to auscultation bilaterally, no wheezing, no crackles. Normal respiratory effort. No accessory muscle use.  Cardiovascular: Regular rate and rhythm, no murmurs / rubs / gallops. No extremity edema. 2+ pedal pulses. No carotid bruits.  Abdomen: no  tenderness, no masses palpated. No hepatosplenomegaly. Bowel sounds positive.  Musculoskeletal: no clubbing / cyanosis. No joint deformity upper and lower extremities. Good ROM, no contractures. Normal muscle tone.  Skin: no rashes, lesions, ulcers. No induration Neurologic: CN 2-12 grossly intact. Sensation intact, DTR normal. Strength 5/5 in all 4.  Psychiatric: Normal judgment and insight. Alert and oriented x 3. Normal mood.    Labs on Admission: I have personally reviewed following labs and imaging studies  CBC: Recent Labs  Lab 01/14/19 2209  WBC 11.5*  NEUTROABS 9.1*  HGB 13.9  HCT 42.8  MCV 91.1  PLT 921   Basic Metabolic Panel: Recent Labs  Lab 01/14/19 2209  NA 136  K 3.9  CL 106  CO2 22  GLUCOSE 166*  BUN 16  CREATININE 1.22  CALCIUM 8.8*   GFR: Estimated Creatinine Clearance: 76.7 mL/min (by C-G formula based on SCr of 1.22 mg/dL). Liver Function Tests: Recent Labs  Lab 01/14/19 2209  AST 18  ALT 15  ALKPHOS 71  BILITOT 0.8  PROT 7.1  ALBUMIN 3.9   No results for input(s): LIPASE, AMYLASE in the last 168 hours. No results for input(s): AMMONIA in the last 168 hours. Coagulation Profile: No results for input(s): INR, PROTIME in the last 168 hours. Cardiac Enzymes: No results for input(s): CKTOTAL, CKMB, CKMBINDEX, TROPONINI in the last 168 hours. BNP (last 3 results) No results for input(s): PROBNP in the last 8760 hours. HbA1C: No results for input(s): HGBA1C in the last 72 hours. CBG: No results for input(s): GLUCAP in the last 168 hours. Lipid Profile: No results for input(s): CHOL, HDL, LDLCALC, TRIG, CHOLHDL, LDLDIRECT in the last 72 hours. Thyroid Function Tests: No results for input(s): TSH, T4TOTAL, FREET4, T3FREE, THYROIDAB in the last 72 hours. Anemia Panel: No results for input(s): VITAMINB12, FOLATE, FERRITIN, TIBC, IRON, RETICCTPCT in the last 72 hours. Urine analysis:    Component Value Date/Time   COLORURINE YELLOW  01/14/2019 2209   APPEARANCEUR CLOUDY (A) 01/14/2019 2209   LABSPEC 1.023 01/14/2019 2209   PHURINE 5.0 01/14/2019 2209   GLUCOSEU NEGATIVE 01/14/2019 2209   HGBUR LARGE (A) 01/14/2019 2209   BILIRUBINUR NEGATIVE 01/14/2019 2209   KETONESUR NEGATIVE 01/14/2019 2209   PROTEINUR 30 (A) 01/14/2019 2209   UROBILINOGEN 1.0 10/25/2014 2204   NITRITE NEGATIVE 01/14/2019 2209   LEUKOCYTESUR LARGE (A) 01/14/2019 2209   Sepsis Labs: !!!!!!!!!!!!!!!!!!!!!!!!!!!!!!!!!!!!!!!!!!!! @LABRCNTIP (procalcitonin:4,lacticidven:4) )No results found for this or any previous visit (from the past 240 hour(s)).   Radiological Exams on Admission: Dg Chest 2 View  Result Date: 01/14/2019 CLINICAL DATA:  Fevers EXAM: CHEST - 2 VIEW COMPARISON:  None. FINDINGS: The heart  size and mediastinal contours are within normal limits. Both lungs are clear. The visualized skeletal structures are unremarkable. IMPRESSION: No active cardiopulmonary disease. Electronically Signed   By: Inez Catalina M.D.   On: 01/14/2019 21:47   Ct Abdomen Pelvis W Contrast  Result Date: 01/14/2019 CLINICAL DATA:  Fevers and left lower quadrant pain EXAM: CT ABDOMEN AND PELVIS WITH CONTRAST TECHNIQUE: Multidetector CT imaging of the abdomen and pelvis was performed using the standard protocol following bolus administration of intravenous contrast. CONTRAST:  150mL ISOVUE-300 IOPAMIDOL (ISOVUE-300) INJECTION 61% COMPARISON:  01/25/2017 FINDINGS: Lower chest: No acute abnormality. Hepatobiliary: The liver demonstrates a large right lobe cyst stable from the previous exam. The gallbladder is decompressed. Pancreas: Unremarkable. No pancreatic ductal dilatation or surrounding inflammatory changes. Spleen: Normal in size without focal abnormality. Adrenals/Urinary Tract: Adrenal glands are within normal limits. The left kidney is again atrophic with dilatation left ureter stable from the prior exam. The left ureter inserts ectopically along the inferior  aspect of the bladder stable from prior exam. The bladder is partially distended. The right kidney shows mild perinephric stranding. No focal mass is seen. Fullness of the collecting system and right ureter is seen although no obstructing stone is identified. Stomach/Bowel: The appendix is within normal limits. The colon is decompressed. No small bowel abnormality is noted. The stomach is unremarkable. Vascular/Lymphatic: Aortic atherosclerotic changes are noted. No aneurysmal dilatation is seen. Scattered small periaortic lymph nodes are identified increased in both size and number when compared with the prior exam. Some of these demonstrate normal fatty hila. Some iliac chain lymph nodes are noted. These appear more prominent than the changes in the retroperitoneum. The largest of these measures 15 mm in short axis best seen on image number 81 of series 2. Reproductive: Prostate is within normal limits. Seminal vesicles are again enlarged with some mild inflammatory change. Localized infection could not be totally excluded. Other: No abdominal wall hernia or abnormality. No abdominopelvic ascites. Musculoskeletal: Bilateral hip replacements are noted. Degenerative changes of the lumbar spine are seen. IMPRESSION: Chronic changes involving the left kidney. Mild inflammatory changes involving the seminal vesicles. These are somewhat enlarged. The size is stable from the prior CT examination although the degree of surrounding inflammatory change is new. Scattered retroperitoneal and iliac chain lymph nodes. These are new from the prior exam and of uncertain significance. They may be reactive secondary to inflammatory change although a more aggressive process such as lymphoma could not be totally excluded. Further workup would be helpful. Mild perinephric stranding involving the right kidney. No obstructing stone is seen. Possibility of underlying inflammatory change deserves consideration. Electronically Signed   By:  Inez Catalina M.D.   On: 01/14/2019 23:48   Old chart reviewed Case discussed with EDP  Assessment/Plan 68 year old male with single horseshoe-like kidney comes in with pyelonephritis Principal Problem:   Pyelonephritis-patient Rocephin 2 g IV every 24 hours.  Obtain urine cultures and blood cultures.  Temperature 103 on arrival.  Patient not septic.  Tylenol as needed for fever.  Supportive care.  Follow-up on culture data.  Renal function good.  Active Problems:   GERD (gastroesophageal reflux disease)-continue home meds    Congenital malformation of kidney-noted   Med rec pending pharmacy review  DVT prophylaxis: scds Code Status: full Family Communication: wife  Disposition Plan:  days Consults called:  none Admission status:  admission   Kamayah Pillay A MD [facility]  If 7PM-7AM, please contact night-coverage [email or web address] Password [facility]  01/15/2019, 12:50 AM

## 2019-01-15 NOTE — Progress Notes (Signed)
MD paged again, as patient would like to start taking his Cozaar 50, Flomax 0.4 and Glucophage 500 daily. Donne Hazel, RN

## 2019-01-15 NOTE — Progress Notes (Signed)
ED TO INPATIENT HANDOFF REPORT  Name/Age/Gender Willie Franco 68 y.o. male  Code Status    Code Status Orders  (From admission, onward)         Start     Ordered   01/15/19 0052  Full code  Continuous     01/15/19 0052        Code Status History    Date Active Date Inactive Code Status Order ID Comments User Context   10/26/2014 0203 10/29/2014 1717 Full Code [id number]  Waldemar Dickens, MD Inpatient      Home/SNF/Other Home  Chief Complaint fever  Level of Care/Admitting Diagnosis ED Disposition    ED [facility] [[id number]]  Level of Care: Med-Surg [16]  Diagnosis: Pyelonephritis [[id number]]  Admitting Physician: Phillips Grout [[id number]]  Attending Physician: Derrill Kay A [[id number]]  Estimated length of stay: 3 - 4 days  Certification:: I certify this patient will need inpatient services for at least 2 midnights  PT Class (Do Not Modify): Inpatient [101]  PT Acc Code (Do Not Modify): Private [1]       Medical History Past Medical History:  Diagnosis Date  . Arthritis   . GERD (gastroesophageal reflux disease)   . Hypertension   . 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 Sleep apnea    no CPAP at present    Allergies No Known Allergies  IV Location/Drains/Wounds Patient Lines/Drains/Airways Status   Active Line/Drains/Airways    Name:   Placement date:   Placement time:   Site:   Days:   Peripheral IV 10/26/14 Left Antecubital   10/26/14    -    Antecubital   1542          Labs/Imaging Results for orders placed or performed during the hospital encounter of 01/14/19 (from the past 48 hour(s))  Lactic acid, plasma     Status: None   Collection Time: 01/14/19 10:09 PM  Result Value Ref Range   Lactic Acid, Venous 1.5 0.5 - 1.9 mmol/L    Comment: Performed at [facility], [place] [address] [place], [place] [address]  Comprehensive metabolic panel      Status: Abnormal   Collection Time: 01/14/19 10:09 PM  Result Value Ref Range   Sodium 136 135 - 145 mmol/L   Potassium 3.9 3.5 - 5.1 mmol/L   Chloride 106 98 - 111 mmol/L   CO2 22 22 - 32 mmol/L   Glucose, Bld 166 (H) 70 - 99 mg/dL   BUN 16 8 - 23 mg/dL   Creatinine, Ser 1.22 0.61 - 1.24 mg/dL   Calcium 8.8 (L) 8.9 - 10.3 mg/dL   Total Protein 7.1 6.5 - 8.1 g/dL   Albumin 3.9 3.5 - 5.0 g/dL   AST 18 15 - 41 U/L   ALT 15 0 - 44 U/L   Alkaline Phosphatase 71 38 - 126 U/L   Total Bilirubin 0.8 0.3 - 1.2 mg/dL   GFR calc non Af Amer >60 >60 mL/min   GFR calc Af Amer >60 >60 mL/min   Anion gap 8 5 - 15    Comment: Performed at [facility], [place] [address], [place], [place] [address]  CBC with Differential     Status: Abnormal   Collection Time: 01/14/19 10:09 PM  Result Value Ref Range   WBC 11.5 (H) 4.0 - 10.5 K/uL   RBC 4.70 4.22 -  5.81 MIL/uL   Hemoglobin 13.9 13.0 - 17.0 g/dL   HCT 42.8 39.0 - 52.0 %   MCV 91.1 80.0 - 100.0 fL   MCH 29.6 26.0 - 34.0 pg   MCHC 32.5 30.0 - 36.0 g/dL   RDW 12.3 11.5 - 15.5 %   Platelets 279 150 - 400 K/uL   nRBC 0.0 0.0 - 0.2 %   Neutrophils Relative % 79 %   Neutro Abs 9.1 (H) 1.7 - 7.7 K/uL   Lymphocytes Relative 11 %   Lymphs Abs 1.3 0.7 - 4.0 K/uL   Monocytes Relative 8 %   Monocytes Absolute 0.9 0.1 - 1.0 K/uL   Eosinophils Relative 1 %   Eosinophils Absolute 0.1 0.0 - 0.5 K/uL   Basophils Relative 0 %   Basophils Absolute 0.0 0.0 - 0.1 K/uL   Immature Granulocytes 1 %   Abs Immature Granulocytes 0.09 (H) 0.00 - 0.07 K/uL    Comment: Performed at [facility], [place] [address], [place], [place] [address]  Urinalysis, Routine w reflex microscopic     Status: Abnormal   Collection Time: 01/14/19 10:09 PM  Result Value Ref Range   Color, Urine YELLOW YELLOW   APPearance CLOUDY (A) CLEAR   Specific Gravity, Urine 1.023 1.005 - 1.030   pH 5.0 5.0 - 8.0   Glucose, UA NEGATIVE NEGATIVE mg/dL   Hgb  urine dipstick LARGE (A) NEGATIVE   Bilirubin Urine NEGATIVE NEGATIVE   Ketones, ur NEGATIVE NEGATIVE mg/dL   Protein, ur 30 (A) NEGATIVE mg/dL   Nitrite NEGATIVE NEGATIVE   Leukocytes,Ua LARGE (A) NEGATIVE   RBC / HPF >50 (H) 0 - 5 RBC/hpf   WBC, UA >50 (H) 0 - 5 WBC/hpf   Bacteria, UA RARE (A) NONE SEEN   WBC Clumps PRESENT    Mucus PRESENT    Non Squamous Epithelial 0-5 (A) NONE SEEN    Comment: Performed at [facility], [place] [address], [place], [place] [address]   Dg Chest 2 View  Result Date: 01/14/2019 CLINICAL DATA:  Fevers EXAM: CHEST - 2 VIEW COMPARISON:  None. FINDINGS: The heart size and mediastinal contours are within normal limits. Both lungs are clear. The visualized skeletal structures are unremarkable. IMPRESSION: No active cardiopulmonary disease. Electronically Signed   By: Inez Catalina M.D.   On: 01/14/2019 21:47   Ct Abdomen Pelvis W Contrast  Result Date: 01/14/2019 CLINICAL DATA:  Fevers and left lower quadrant pain EXAM: CT ABDOMEN AND PELVIS WITH CONTRAST TECHNIQUE: Multidetector CT imaging of the abdomen and pelvis was performed using the standard protocol following bolus administration of intravenous contrast. CONTRAST:  11mL ISOVUE-300 IOPAMIDOL (ISOVUE-300) INJECTION 61% COMPARISON:  01/25/2017 FINDINGS: Lower chest: No acute abnormality. Hepatobiliary: The liver demonstrates a large right lobe cyst stable from the previous exam. The gallbladder is decompressed. Pancreas: Unremarkable. No pancreatic ductal dilatation or surrounding inflammatory changes. Spleen: Normal in size without focal abnormality. Adrenals/Urinary Tract: Adrenal glands are within normal limits. The left kidney is again atrophic with dilatation left ureter stable from the prior exam. The left ureter inserts ectopically along the inferior aspect of the bladder stable from prior exam. The bladder is partially distended. The right kidney shows mild perinephric stranding. No focal  mass is seen. Fullness of the collecting system and right ureter is seen although no obstructing stone is identified. Stomach/Bowel: The appendix is within normal limits. The colon is decompressed. No small bowel abnormality is noted. The stomach is unremarkable. Vascular/Lymphatic:  Aortic atherosclerotic changes are noted. No aneurysmal dilatation is seen. Scattered small periaortic lymph nodes are identified increased in both size and number when compared with the prior exam. Some of these demonstrate normal fatty hila. Some iliac chain lymph nodes are noted. These appear more prominent than the changes in the retroperitoneum. The largest of these measures 15 mm in short axis best seen on image number 81 of series 2. Reproductive: Prostate is within normal limits. Seminal vesicles are again enlarged with some mild inflammatory change. Localized infection could not be totally excluded. Other: No abdominal wall hernia or abnormality. No abdominopelvic ascites. Musculoskeletal: Bilateral hip replacements are noted. Degenerative changes of the lumbar spine are seen. IMPRESSION: Chronic changes involving the left kidney. Mild inflammatory changes involving the seminal vesicles. These are somewhat enlarged. The size is stable from the prior CT examination although the degree of surrounding inflammatory change is new. Scattered retroperitoneal and iliac chain lymph nodes. These are new from the prior exam and of uncertain significance. They may be reactive secondary to inflammatory change although a more aggressive process such as lymphoma could not be totally excluded. Further workup would be helpful. Mild perinephric stranding involving the right kidney. No obstructing stone is seen. Possibility of underlying inflammatory change deserves consideration. Electronically Signed   By: Inez Catalina M.D.   On: 01/14/2019 23:48    Pending Labs Unresulted Labs (From admission, onward)    Start     Ordered   01/15/19 0052   Urine culture  Once,   R     01/15/19 0052   01/14/19 2214  Blood culture (routine x 2)  BLOOD CULTURE X 2,   STAT     01/14/19 2213   01/14/19 2213  Urine culture  ONCE - STAT,   STAT     01/14/19 2212          Vitals/Pain Today's Vitals   01/14/19 2352 01/15/19 0000 01/15/19 0030 01/15/19 0100  BP:  129/65 (!) 114/59 124/83  Pulse:  84 76 75  Resp:      Temp:      TempSrc:      SpO2:  94% 93% 94%  Weight:      Height:      PainSc: 0-No pain       Isolation Precautions No active isolations  Medications Medications  iopamidol (ISOVUE-300) 61 % injection (has no administration in time range)  sodium chloride (PF) 0.9 % injection (has no administration in time range)  acetaminophen (TYLENOL) tablet 1,000 mg (has no administration in time range)  sodium chloride flush (NS) 0.9 % injection 3 mL (has no administration in time range)  sodium chloride flush (NS) 0.9 % injection 3 mL (has no administration in time range)  0.9 %  sodium chloride infusion (has no administration in time range)  ondansetron (ZOFRAN) tablet 4 mg (has no administration in time range)    Or  ondansetron (ZOFRAN) injection 4 mg (has no administration in time range)  cefTRIAXone (ROCEPHIN) 2 g in sodium chloride 0.9 % 100 mL IVPB (has no administration in time range)  sodium chloride flush (NS) 0.9 % injection 3 mL (3 mLs Intravenous Given 01/14/19 2247)  acetaminophen (TYLENOL) tablet 650 mg (650 mg Oral Given 01/14/19 2218)  sodium chloride 0.9 % bolus 1,000 mL (0 mLs Intravenous Stopped 01/15/19 0107)  iopamidol (ISOVUE-300) 61 % injection 100 mL (100 mLs Intravenous Contrast Given 01/14/19 2314)  cefTRIAXone (ROCEPHIN) 1 g in sodium chloride 0.9 % 100  mL IVPB (0 g Intravenous Stopped 01/15/19 0107)    Mobility walks

## 2019-01-15 NOTE — Progress Notes (Signed)
Dr Benny Lennert paged, as patient ids asking about his regular medication. Donne Hazel, RN

## 2019-01-15 NOTE — Progress Notes (Signed)
  PROGRESS NOTE  ZAEVION PARKE OAC:[id number] DOB: [birth year] DOA: 01/14/2019 PCP: Kristopher Glee., MD  Brief History   Willie Franco is a 68 y.o. male with medical history significant of congenital horseshoe right kidney, hypertension comes in with over 1 day of right lower quadrant discomfort that radiating from his right flank.  He describes it not really as pain but just a discomfort feeling.  He started running fever last night of 101.  He has had no nausea vomiting or diarrhea.  He denies any dysuria or hematuria.  He denies any upper respiratory tract symptoms or any flulike symptoms.  He does not frequently get UTIs and is not on antibiotics frequently.  His last hospitalization that was kidney related was 5 years ago.  Patient is being referred for admission for pyelonephritis.  A & P  Principal Problem: Pyelonephritis-patient Rocephin 2 g IV every 24 hours. Urina dnn blood cultures have had no growth. IV lfuids and supportive care.  Mild AKI in patient with a single functional kidney due to congenital malformation: Hold ARB, metformin, ibuprofen, and flomax. IV fluids. Will restart all home meds if creatinine looks better in the am.  GERD (gastroesophageal reflux disease): Prilosec  Hypertension: Norvasc to control blood pressures.  DVT prophylaxis: SCD's Code Status: Full Code Family Communication: Emergency contact Johnte Portnoy ( Spouse) [phone] Disposition Plan: Home   Brighton Pilley, DO [facility] Direct contact: see [email or web address]  7PM-7AM contact night coverage as above 01/15/2019, 5:57 PM  LOS: 0 days    Antibiotics  . Rocephin  Interval History/Subjective  The patient is resting comfortably. No new complaints.  Objective   Vitals:  Vitals:   01/15/19 0938 01/15/19 1353  BP:  (!) 142/83  Pulse:  74  Resp:  16  Temp: 98.8 F (37.1 C) 98.5 F (36.9 C)  SpO2:  94%    Exam:  Constitutional:  . The patient is awake, alert, and orieinted x  3.  Respiratory:  . No wheezes, rales, or rhonchi  . No increased work of breathing.  . No tactile fremitus Cardiovascular:  . Regular rate and rhythm. No murmurs, ectopy, or gallups. . No LE extremity edema   . Normal pedal pulses Abdomen:  . Soft and non-distended. Milld tenderness in suprapubic region. . No hernias, masses, or organomegaly . Normoactive bowel sounds.  Musculoskeletal: No cyanosis, clubbing or edema  Skin:  . No rashes, lesions, ulcers . palpation of skin: no induration or nodules Neurologic:  . CN 2-12 intact . Sensation all 4 extremities intact Psychiatric:  . Mental status o Mood, affect appropriate o Orientation to person, place, time  . judgment and insight appear intact     I have personally reviewed the following:   Today's Data  . CBC, Chemistry, cultures, and vitals.   Scheduled Meds: . atorvastatin  80 mg Oral Daily  . sodium chloride flush  3 mL Intravenous Q12H   Continuous Infusions: . sodium chloride    . cefTRIAXone (ROCEPHIN)  IV 2 g (01/15/19 0908)    Principal Problem:   Pyelonephritis Active Problems:   GERD (gastroesophageal reflux disease)   Congenital malformation of kidney   LOS: 0 days

## 2019-01-15 NOTE — Progress Notes (Signed)
Patient has temp of 101.3, MD notified via web links. Administered 1000 mg tylenol @ 0600. Dawson Bills, RN

## 2019-01-15 NOTE — Progress Notes (Signed)
Dr Sue Lush paged, per patient request, to ask about pain medication. Donne Hazel, RN

## 2019-01-16 LAB — CBC WITH DIFFERENTIAL/PLATELET
Abs Immature Granulocytes: 0.05 10*3/uL (ref 0.00–0.07)
Basophils Absolute: 0 10*3/uL (ref 0.0–0.1)
Basophils Relative: 0 %
Eosinophils Absolute: 0.2 10*3/uL (ref 0.0–0.5)
Eosinophils Relative: 2 %
HCT: 43.3 % (ref 39.0–52.0)
HEMOGLOBIN: 14 g/dL (ref 13.0–17.0)
Immature Granulocytes: 0 %
Lymphocytes Relative: 18 %
Lymphs Abs: 2 10*3/uL (ref 0.7–4.0)
MCH: 29 pg (ref 26.0–34.0)
MCHC: 32.3 g/dL (ref 30.0–36.0)
MCV: 89.8 fL (ref 80.0–100.0)
Monocytes Absolute: 1.5 10*3/uL — ABNORMAL HIGH (ref 0.1–1.0)
Monocytes Relative: 13 %
Neutro Abs: 7.7 10*3/uL (ref 1.7–7.7)
Neutrophils Relative %: 67 %
Platelets: 248 10*3/uL (ref 150–400)
RBC: 4.82 MIL/uL (ref 4.22–5.81)
RDW: 12.2 % (ref 11.5–15.5)
WBC: 11.6 10*3/uL — AB (ref 4.0–10.5)
nRBC: 0 % (ref 0.0–0.2)

## 2019-01-16 LAB — BASIC METABOLIC PANEL
Anion gap: 9 (ref 5–15)
BUN: 12 mg/dL (ref 8–23)
CHLORIDE: 103 mmol/L (ref 98–111)
CO2: 23 mmol/L (ref 22–32)
Calcium: 8.4 mg/dL — ABNORMAL LOW (ref 8.9–10.3)
Creatinine, Ser: 1.15 mg/dL (ref 0.61–1.24)
GFR calc Af Amer: >60 mL/min (ref >60)
GFR calc non Af Amer: >60 mL/min (ref >60)
Glucose, Bld: 119 mg/dL — ABNORMAL HIGH (ref 70–99)
Potassium: 4.3 mmol/L (ref 3.5–5.1)
SODIUM: 135 mmol/L (ref 135–145)

## 2019-01-16 LAB — GLUCOSE, CAPILLARY
Glucose-Capillary: 140 mg/dL — ABNORMAL HIGH (ref 70–99)
Glucose-Capillary: 126 mg/dL — ABNORMAL HIGH (ref 70–99)

## 2019-01-16 LAB — HEMOGLOBIN A1C
Hgb A1c MFr Bld: 6.6 % — ABNORMAL HIGH (ref 4.8–5.6)
Mean Plasma Glucose: 142.72 mg/dL

## 2019-01-16 MED ORDER — INSULIN ASPART 100 UNIT/ML ~~LOC~~ SOLN
0.0000 [IU] | Freq: Three times a day (TID) | SUBCUTANEOUS | Status: DC
  Administered 2019-01-16 – 2019-01-18 (×3): 1 [IU] via SUBCUTANEOUS
  Administered 2019-01-18: 2 [IU] via SUBCUTANEOUS

## 2019-01-16 MED ORDER — SODIUM CHLORIDE 0.45 % IV SOLN
INTRAVENOUS | Status: DC
  Administered 2019-01-16 – 2019-01-17 (×2): via INTRAVENOUS

## 2019-01-16 NOTE — Progress Notes (Signed)
PROGRESS NOTE  Willie Franco IRJ:[id number] DOB: 03-04-1951 DOA: 01/14/2019 PCP: Kristopher Glee., MD  HPI/Recap of past 24 hours: Willie Guild Butleris a 68 y.o.malewith medical history significant ofcongenital horseshoe right kidney, hypertension comes in with over 1 day of right lower quadrant discomfort that radiating from his right flank. He describes it not really as pain but just a discomfort feeling. He started running fever last night of 101. He has had no nausea vomiting or diarrhea. He denies any dysuria or hematuria. He denies any upper respiratory tract symptoms or any flulike symptoms. He does not frequently get UTIs and is not on antibiotics frequently. His last hospitalization that was kidney related was 5 years ago. Patient is being referred for admission for pyelonephritis.  01/16/2019: Patient seen and examined with his wife who is also NP at bedside.  No acute events overnight.  Continues to have discomfort in his right flank.  Last fever recorded on 01/15/2019 at 6:32 AM with T-max of 101.3.     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 Avoid nephrotoxic agents/dehydration/hypotension Start gentle IV fluid hydration half-normal saline at 75 cc/h due to recent IV contrast use for CT scanning of abdomen and pelvis Monitor urine output Continue to hold off losartan for now Monitor renal function and repeat BMP in the morning  GERD Continue  Prilosec  Hypertension Blood pressure is at goal Continue Norvasc  Hyperlipidemia Continue Lipitor 80 mg daily  Type 2 diabetes On metformin at home, hold metformin Systolic sensitive insulin sliding scale Obtain A1c  BPH On tamsulosin at home Monitor urine output   DVT prophylaxis: SCD's Code Status: Full Code Family Communication: Emergency contact Willie Franco ( Spouse) [phone] Disposition Plan:  Home in 1 to 2 days after receiving 2 days of IV antibiotics     Antibiotics   Rocephin    Objective: Vitals:   01/15/19 0938 01/15/19 1353 01/16/19 0519 01/16/19 1318  BP:  (!) 142/83 122/78 120/72  Pulse:  74 72 69  Resp:  16 14 14   Temp: 98.8 F (37.1 C) 98.5 F (36.9 C) 98.5 F (36.9 C) 98.7 F (37.1 C)  TempSrc: Oral Oral Oral Oral  SpO2:  94% 96% 96%  Weight:      Height:        Intake/Output Summary (Last 24 hours) at 01/16/2019 1537 Last data filed at 01/16/2019 1136 Gross per 24 hour  Intake 304.49 ml  Output 640 ml  Net -335.51 ml   Filed Weights   01/14/19 2127  Weight: 118 kg    Exam:  . General: 68 y.o. year-old male well developed well nourished in no acute distress.  Alert and oriented x3. . Cardiovascular: Regular rate and rhythm with no rubs or gallops.  No thyromegaly or JVD noted.   Marland Kitchen Respiratory: Clear to auscultation with no wheezes or rales. Good inspiratory effort. . Abdomen: Soft nontender nondistended with normal bowel sounds x4 quadrants.  Mild discomfort right flank. . Musculoskeletal: No lower extremity edema. 2/4 pulses in all 4 extremities. . Skin: No  ulcerative lesions noted or rashes, . Psychiatry: Mood is appropriate for condition and setting   Data Reviewed: CBC: Recent Labs  Lab 01/14/19 2209 01/16/19 0426  WBC 11.5* 11.6*  NEUTROABS 9.1* 7.7  HGB 13.9 14.0  HCT 42.8 43.3  MCV 91.1 89.8  PLT 279 562   Basic Metabolic Panel: Recent Labs  Lab 01/14/19 2209 01/16/19 0426  NA 136 135  K 3.9  4.3  CL 106 103  CO2 22 23  GLUCOSE 166* 119*  BUN 16 12  CREATININE 1.22 1.15  CALCIUM 8.8* 8.4*   GFR: Estimated Creatinine Clearance: 81.3 mL/min (by C-G formula based on SCr of 1.15 mg/dL). Liver Function Tests: Recent Labs  Lab 01/14/19 2209  AST 18  ALT 15  ALKPHOS 71  BILITOT 0.8  PROT 7.1  ALBUMIN 3.9   No results for input(s): LIPASE, AMYLASE in the last 168 hours. No results for input(s): AMMONIA in the last 168 hours. Coagulation Profile: No results for input(s): INR, PROTIME in the last 168 hours. Cardiac Enzymes: No results for input(s): CKTOTAL, CKMB, CKMBINDEX, TROPONINI in the last 168 hours. BNP (last 3 results) No results for input(s): PROBNP in the last 8760 hours. HbA1C: No results for input(s): HGBA1C in the last 72 hours. CBG: No results for input(s): GLUCAP in the last 168 hours. Lipid Profile: No results for input(s): CHOL, HDL, LDLCALC, TRIG, CHOLHDL, LDLDIRECT in the last 72 hours. Thyroid Function Tests: No results for input(s): TSH, T4TOTAL, FREET4, T3FREE, THYROIDAB in the last 72 hours. Anemia Panel: No results for input(s): VITAMINB12, FOLATE, FERRITIN, TIBC, IRON, RETICCTPCT in the last 72 hours. Urine analysis:    Component Value Date/Time   COLORURINE YELLOW 01/14/2019 2209   APPEARANCEUR CLOUDY (A) 01/14/2019 2209   LABSPEC 1.023 01/14/2019 2209   PHURINE 5.0 01/14/2019 2209   GLUCOSEU NEGATIVE 01/14/2019 2209   HGBUR LARGE (A) 01/14/2019 2209   BILIRUBINUR NEGATIVE 01/14/2019 2209   KETONESUR NEGATIVE 01/14/2019 2209   PROTEINUR 30 (A) 01/14/2019 2209   UROBILINOGEN 1.0 10/25/2014 2204   NITRITE NEGATIVE 01/14/2019 2209   LEUKOCYTESUR LARGE (A) 01/14/2019 2209   Sepsis Labs: @LABRCNTIP (procalcitonin:4,lacticidven:4)  ) Recent Results (from the past 240 hour(s))  Urine culture     Status: Abnormal (Preliminary result)   Collection Time: 01/14/19 10:13 PM  Result Value Ref Range Status   Specimen Description   Final     URINE, CLEAN CATCH Performed at [facility], [place] [address] [place], [place] [address]    Special Requests   Final    NONE Performed at [facility], [place] [address] [place], [place] [address]    Culture 80,000 COLONIES/mL [facility] MORGANII (A)  Final   Report Status PENDING  Incomplete  Blood culture (routine x 2)     Status: None (Preliminary result)   Collection Time: 01/14/19 10:14 PM  Result Value Ref Range Status   Specimen Description   Final    BLOOD RIGHT ANTECUBITAL Performed at [place] [address] [place], [place] [address]    Special Requests   Final    BOTTLES DRAWN AEROBIC AND ANAEROBIC Blood Culture results may not be optimal due to an excessive volume of blood received in culture bottles Performed at [place] [address] [place], [place] [address]    Culture   Final    NO GROWTH 1 DAY Performed at [facility], [place] [address], [place], [place] [address]  Report Status PENDING  Incomplete  Blood culture (routine x 2)     Status: None (Preliminary result)   Collection Time: 01/14/19 10:45 PM  Result Value Ref Range Status   Specimen Description   Final    BLOOD LEFT ANTECUBITAL Performed at [place] [address] [place], [place] [address]    Special Requests   Final    BOTTLES DRAWN AEROBIC AND ANAEROBIC Blood Culture adequate volume Performed at [place] [address], [place], [place] [address]    Culture   Final    NO GROWTH 1 DAY Performed at [facility], [place] [address], [place], [place] [address]    Report Status PENDING  Incomplete      Studies: No results found.  Scheduled Meds: . amLODipine  10 mg Oral Daily  . atorvastatin  80 mg Oral Daily  . sodium chloride flush  3 mL Intravenous Q12H    Continuous Infusions: . sodium chloride Stopped (01/16/19 0947)  . sodium chloride     . cefTRIAXone (ROCEPHIN)  IV 200 mL/hr at 01/16/19 1000     LOS: 1 day     Kayleen Memos, MD [facility] Pager [phone]  If 7PM-7AM, please contact night-coverage [email or web address] Password [facility] 01/16/2019, 3:37 PM

## 2019-01-17 ENCOUNTER — Inpatient Hospital Stay (HOSPITAL_COMMUNITY): Payer: Federal, State, Local not specified - PPO

## 2019-01-17 LAB — URINE CULTURE: Culture: 80000 — AB

## 2019-01-17 LAB — CBC WITH DIFFERENTIAL/PLATELET
Abs Immature Granulocytes: 0.03 10*3/uL (ref 0.00–0.07)
Basophils Absolute: 0 10*3/uL (ref 0.0–0.1)
Basophils Relative: 0 %
Eosinophils Absolute: 0.5 10*3/uL (ref 0.0–0.5)
Eosinophils Relative: 4 %
HCT: 41.7 % (ref 39.0–52.0)
Hemoglobin: 13.9 g/dL (ref 13.0–17.0)
Immature Granulocytes: 0 %
Lymphocytes Relative: 21 %
Lymphs Abs: 2.5 10*3/uL (ref 0.7–4.0)
MCH: 29.3 pg (ref 26.0–34.0)
MCHC: 33.3 g/dL (ref 30.0–36.0)
MCV: 88 fL (ref 80.0–100.0)
MONOS PCT: 13 %
Monocytes Absolute: 1.6 10*3/uL — ABNORMAL HIGH (ref 0.1–1.0)
Neutro Abs: 7.1 10*3/uL (ref 1.7–7.7)
Neutrophils Relative %: 62 %
Platelets: 242 10*3/uL (ref 150–400)
RBC: 4.74 MIL/uL (ref 4.22–5.81)
RDW: 12.2 % (ref 11.5–15.5)
WBC: 11.6 10*3/uL — ABNORMAL HIGH (ref 4.0–10.5)
nRBC: 0 % (ref 0.0–0.2)

## 2019-01-17 LAB — GLUCOSE, CAPILLARY
GLUCOSE-CAPILLARY: 105 mg/dL — AB (ref 70–99)
GLUCOSE-CAPILLARY: 123 mg/dL — AB (ref 70–99)
Glucose-Capillary: 109 mg/dL — ABNORMAL HIGH (ref 70–99)
Glucose-Capillary: 134 mg/dL — ABNORMAL HIGH (ref 70–99)

## 2019-01-17 LAB — COMPREHENSIVE METABOLIC PANEL
ALT: 24 U/L (ref 0–44)
AST: 22 U/L (ref 15–41)
Albumin: 3.2 g/dL — ABNORMAL LOW (ref 3.5–5.0)
Alkaline Phosphatase: 53 U/L (ref 38–126)
Anion gap: 7 (ref 5–15)
BILIRUBIN TOTAL: 0.5 mg/dL (ref 0.3–1.2)
BUN: 12 mg/dL (ref 8–23)
CO2: 23 mmol/L (ref 22–32)
CREATININE: 1.09 mg/dL (ref 0.61–1.24)
Calcium: 8.1 mg/dL — ABNORMAL LOW (ref 8.9–10.3)
Chloride: 105 mmol/L (ref 98–111)
GFR calc Af Amer: >60 mL/min (ref >60)
GFR calc non Af Amer: >60 mL/min (ref >60)
Glucose, Bld: 137 mg/dL — ABNORMAL HIGH (ref 70–99)
Potassium: 3.8 mmol/L (ref 3.5–5.1)
Sodium: 135 mmol/L (ref 135–145)
Total Protein: 6.2 g/dL — ABNORMAL LOW (ref 6.5–8.1)

## 2019-01-17 MED ORDER — TAMSULOSIN HCL 0.4 MG PO CAPS
0.4000 mg | ORAL_CAPSULE | Freq: Every day | ORAL | Status: DC
  Administered 2019-01-17 – 2019-01-19 (×3): 0.4 mg via ORAL
  Filled 2019-01-17 (×3): qty 1

## 2019-01-17 MED ORDER — VITAMIN D 25 MCG (1000 UNIT) PO TABS
2000.0000 [IU] | ORAL_TABLET | Freq: Every day | ORAL | Status: DC
  Administered 2019-01-17 – 2019-01-19 (×3): 2000 [IU] via ORAL
  Filled 2019-01-17 (×3): qty 2

## 2019-01-17 NOTE — Progress Notes (Signed)
I have reviewed and concur with this student's documentation.   

## 2019-01-17 NOTE — Progress Notes (Addendum)
PROGRESS NOTE  Willie Franco WJX:[id number] DOB: [birth year] DOA: 01/14/2019 PCP: Kristopher Glee., MD  HPI/Recap of past 24 hours: Willie Guild Butleris a 68 y.o.malewith medical history significant ofcongenital horseshoe right kidney, hypertension comes in with over 1 day of right lower quadrant discomfort that radiating from his right flank. He describes it not really as pain but just a discomfort feeling. He started running fever last night of 101. He has had no nausea vomiting or diarrhea. He denies any dysuria or hematuria. He denies any upper respiratory tract symptoms or any flulike symptoms. He does not frequently get UTIs and is not on antibiotics frequently. His last hospitalization that was kidney related was 5 years ago. Patient is being referred for admission for pyelonephritis.  01/16/2019: Patient seen and examined with his wife who is also NP at bedside.  No acute events overnight.  Continues to have discomfort in his right flank.  Last fever recorded on 01/15/2019 at 6:32 AM with T-max of 101.3.    01/17/2019: Patient seen and examined at bedside.  No acute events overnight.  Has no new complaints.  Discussed with Dr. Earlie Server from heme oncology regarding incidental findings on CT abdomen and pelvis- new scattered retroperitoneal and iliac lymph nodes.  He recommended completing course of antibiotics and to repeat imaging in 6 months to see if they have resolved.   Assessment/Plan: Principal Problem:   Pyelonephritis Active Problems:   GERD (gastroesophageal reflux disease)   Congenital malformation of kidney  Acute pyelonephritis in the setting of single functional kidney due to congenital malformation Presented with fever with T-max of 101.3 and mild perinephric stranding involving the right kidney UA positive for pyuria Urine culture grew 80,000 colonies of Morganela morganii sensitive to Zosyn, ciprofloxacin, and Rocephin Will switch to oral Cipro tomorrow in  anticipation for discharge Blood cultures x2 negative to date Continue Rocephin IV 2 g daily  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 Earlie Server of heme oncology who recommended finishing course of antibiotics and repeating imaging in 6 months  BPH with abnormalities noted on CT abd pelvis w contrast Resume tamsulosin Monitor U/O Possible seminal vesicles inflammatory changes; localized infection could not be totally excluded MRI abd pelvis without contrast to further assess  GERD Continue Prilosec  Hypertension Blood pressure is at goal Continue Norvasc  Hyperlipidemia Continue Lipitor 80 mg daily  Type 2 diabetes On metformin at home, hold metformin Insulin sensitive insulin sliding scale A1c 6.6 on 01/16/2019  BPH On tamsulosin at home Monitor urine output   DVT prophylaxis: SCD's Code Status: Full Code Family Communication: Emergency contact Yutaka Holberg ( Spouse) [phone] Disposition Plan:  Home in 1 to 2 days after receiving 2 days of IV antibiotics     Antibiotics   Rocephin    Objective: Vitals:   01/16/19 2118 01/17/19 0639 01/17/19 0935 01/17/19 1000  BP: 133/79 129/75 (!) 144/80   Pulse: 67 68 76  Resp: 16 16 16    Temp: 98.6 F (37 C) 99.1 F (37.3 C)  98.5 F (36.9 C)  TempSrc: Oral Oral  Oral  SpO2: 95% 96% 97%   Weight:       Height:        Intake/Output Summary (Last 24 hours) at 01/17/2019 1353 Last data filed at 01/17/2019 0640 Gross per 24 hour  Intake 1465.49 ml  Output 790 ml  Net 675.49 ml   Filed Weights   01/14/19 2127  Weight: 118 kg    Exam:  . General: 68 y.o. year-old male well-developed well-nourished no acute distress.  Alert oriented x3 . Cardiovascular: Regular rate and rhythm with no rubs or gallops.  No JVD or thyromegaly noted. Marland Kitchen Respiratory: Clear to auscultation with no wheezes or rales.  Good inspiratory effort.  . Abdomen: Soft nontender nondistended with normal bowel sounds x4 quadrants.  Mild discomfort right flank. . Musculoskeletal: No lower extremity edema. 2/4 pulses in all 4 extremities. . Skin: No ulcerative lesions noted or rashes, . Psychiatry: Mood is appropriate for condition and setting   Data Reviewed: CBC: Recent Labs  Lab 01/14/19 2209 01/16/19 0426 01/17/19 0434  WBC 11.5* 11.6* 11.6*  NEUTROABS 9.1* 7.7 7.1  HGB 13.9 14.0 13.9  HCT 42.8 43.3 41.7  MCV 91.1 89.8 88.0  PLT 279 248 878   Basic Metabolic Panel: Recent Labs  Lab 01/14/19 2209 01/16/19 0426 01/17/19 0434  NA 136 135 135  K 3.9 4.3 3.8  CL 106 103 105  CO2 22 23 23   GLUCOSE 166* 119* 137*  BUN 16 12 12   CREATININE 1.22 1.15 1.09  CALCIUM 8.8* 8.4* 8.1*   GFR: Estimated Creatinine Clearance: 85.8 mL/min (by C-G formula based on SCr of 1.09 mg/dL). Liver Function Tests: Recent Labs  Lab 01/14/19 2209 01/17/19 0434  AST 18 22  ALT 15 24  ALKPHOS 71 53  BILITOT 0.8 0.5  PROT 7.1 6.2*  ALBUMIN 3.9 3.2*   No results for input(s): LIPASE, AMYLASE in the last 168 hours. No results for input(s): AMMONIA in the last 168 hours. Coagulation Profile: No results for input(s): INR, PROTIME in the last 168 hours. Cardiac Enzymes: No results for input(s): CKTOTAL, CKMB, CKMBINDEX, TROPONINI in the last 168 hours. BNP (last 3 results) No results for input(s): PROBNP in the last  8760 hours. HbA1C: Recent Labs    01/16/19 0426  HGBA1C 6.6*   CBG: Recent Labs  Lab 01/16/19 1632 01/16/19 2223 01/17/19 0735 01/17/19 1221  GLUCAP 140* 126* 109* 134*   Lipid Profile: No results for input(s): CHOL, HDL, LDLCALC, TRIG, CHOLHDL, LDLDIRECT in the last 72 hours. Thyroid Function Tests: No results for input(s): TSH, T4TOTAL, FREET4, T3FREE, THYROIDAB in the last 72 hours. Anemia Panel: No results for input(s): VITAMINB12, FOLATE, FERRITIN, TIBC, IRON, RETICCTPCT in the last 72 hours. Urine analysis:    Component Value Date/Time   COLORURINE YELLOW 01/14/2019 2209   APPEARANCEUR CLOUDY (A) 01/14/2019 2209   LABSPEC 1.023 01/14/2019 2209   PHURINE 5.0 01/14/2019 2209   GLUCOSEU NEGATIVE 01/14/2019 2209   HGBUR LARGE (A) 01/14/2019 2209   BILIRUBINUR NEGATIVE 01/14/2019 2209   KETONESUR NEGATIVE 01/14/2019 2209   PROTEINUR 30 (A) 01/14/2019 2209   UROBILINOGEN 1.0 10/25/2014 2204   NITRITE NEGATIVE 01/14/2019 2209   LEUKOCYTESUR LARGE (A) 01/14/2019 2209   Sepsis Labs: @LABRCNTIP (procalcitonin:4,lacticidven:4)  ) Recent Results (from the past 240 hour(s))  Urine culture     Status: Abnormal   Collection  Time: 01/14/19 10:13 PM  Result Value Ref Range Status   Specimen Description   Final    URINE, CLEAN CATCH Performed at [facility], [place] [address], [place], [place] [address]    Special Requests   Final    NONE Performed at [facility], [place] [address], [place], [place] [address]    Culture 80,000 COLONIES/mL [facility] MORGANII (A)  Final   Report Status 01/17/2019 FINAL  Final   Organism ID, Bacteria MORGANELLA MORGANII (A)  Final      Susceptibility   Morganella morganii - MIC*    AMPICILLIN >=32 RESISTANT Resistant     CEFAZOLIN >=64 RESISTANT Resistant     CEFTRIAXONE <=1 SENSITIVE Sensitive     CIPROFLOXACIN 1 SENSITIVE Sensitive     GENTAMICIN >=16 RESISTANT Resistant     IMIPENEM 1 SENSITIVE  Sensitive     NITROFURANTOIN 128 RESISTANT Resistant     TRIMETH/SULFA >=320 RESISTANT Resistant     AMPICILLIN/SULBACTAM >=32 RESISTANT Resistant     PIP/TAZO <=4 SENSITIVE Sensitive     * 80,000 COLONIES/mL MORGANELLA MORGANII  Blood culture (routine x 2)     Status: None (Preliminary result)   Collection Time: 01/14/19 10:14 PM  Result Value Ref Range Status   Specimen Description   Final    BLOOD RIGHT ANTECUBITAL Performed at [facility], [place] [address], [place], [place] [address]    Special Requests   Final    BOTTLES DRAWN AEROBIC AND ANAEROBIC Blood Culture results may not be optimal due to an excessive volume of blood received in culture bottles Performed at [place] [address] [place], [place] [address]    Culture   Final    NO GROWTH 2 DAYS Performed at [place] [address], [place], [place] [address]    Report Status PENDING  Incomplete  Blood culture (routine x 2)     Status: None (Preliminary result)   Collection Time: 01/14/19 10:45 PM  Result Value Ref Range Status   Specimen Description   Final    BLOOD LEFT ANTECUBITAL Performed at [place] [address] [place], [place] [address]    Special Requests   Final    BOTTLES DRAWN AEROBIC AND ANAEROBIC Blood Culture adequate volume Performed at [place] [address] [place], [place] [address]    Culture   Final    NO GROWTH 2 DAYS Performed at [place] [address] [place],  [address]    Report Status PENDING  Incomplete      Studies: No results found.  Scheduled Meds: . amLODipine  10 mg Oral Daily  . atorvastatin  80 mg Oral Daily  . insulin aspart  0-9 Units Subcutaneous TID WC  . sodium chloride flush  3 mL Intravenous Q12H    Continuous Infusions: . sodium chloride 75 mL/hr at 01/17/19 0140  . sodium chloride    . cefTRIAXone (ROCEPHIN)  IV 2 g (01/17/19 1006)       LOS: 2 days     Kayleen Memos, MD [facility] Pager [phone]  If 7PM-7AM, please contact night-coverage [email or web address] Password [email or web address] 01/17/2019, 1:53 PM

## 2019-01-18 LAB — CBC WITH DIFFERENTIAL/PLATELET
ABS IMMATURE GRANULOCYTES: 0.05 10*3/uL (ref 0.00–0.07)
Basophils Absolute: 0 10*3/uL (ref 0.0–0.1)
Basophils Relative: 0 %
Eosinophils Absolute: 0.4 10*3/uL (ref 0.0–0.5)
Eosinophils Relative: 3 %
HEMATOCRIT: 43.1 % (ref 39.0–52.0)
HEMOGLOBIN: 14.1 g/dL (ref 13.0–17.0)
Immature Granulocytes: 0 %
LYMPHS PCT: 19 %
Lymphs Abs: 2.3 10*3/uL (ref 0.7–4.0)
MCH: 28.7 pg (ref 26.0–34.0)
MCHC: 32.7 g/dL (ref 30.0–36.0)
MCV: 87.6 fL (ref 80.0–100.0)
Monocytes Absolute: 1.6 10*3/uL — ABNORMAL HIGH (ref 0.1–1.0)
Monocytes Relative: 13 %
NEUTROS ABS: 8 10*3/uL — AB (ref 1.7–7.7)
Neutrophils Relative %: 65 %
Platelets: 282 10*3/uL (ref 150–400)
RBC: 4.92 MIL/uL (ref 4.22–5.81)
RDW: 12.2 % (ref 11.5–15.5)
WBC: 12.3 10*3/uL — ABNORMAL HIGH (ref 4.0–10.5)
nRBC: 0 % (ref 0.0–0.2)

## 2019-01-18 LAB — GLUCOSE, CAPILLARY
Glucose-Capillary: 103 mg/dL — ABNORMAL HIGH (ref 70–99)
Glucose-Capillary: 151 mg/dL — ABNORMAL HIGH (ref 70–99)
Glucose-Capillary: 147 mg/dL — ABNORMAL HIGH (ref 70–99)
Glucose-Capillary: 142 mg/dL — ABNORMAL HIGH (ref 70–99)

## 2019-01-18 LAB — BASIC METABOLIC PANEL
Anion gap: 8 (ref 5–15)
BUN: 18 mg/dL (ref 8–23)
CO2: 24 mmol/L (ref 22–32)
Calcium: 8.7 mg/dL — ABNORMAL LOW (ref 8.9–10.3)
Chloride: 105 mmol/L (ref 98–111)
Creatinine, Ser: 1.03 mg/dL (ref 0.61–1.24)
GFR calc Af Amer: >60 mL/min (ref >60)
GFR calc non Af Amer: >60 mL/min (ref >60)
Glucose, Bld: 115 mg/dL — ABNORMAL HIGH (ref 70–99)
POTASSIUM: 4.2 mmol/L (ref 3.5–5.1)
Sodium: 137 mmol/L (ref 135–145)

## 2019-01-18 MED ORDER — CIPROFLOXACIN HCL 500 MG PO TABS: 500.0000 mg | ORAL_TABLET | Freq: Two times a day (BID) | ORAL | 0 refills | Status: DC

## 2019-01-18 MED ORDER — ENOXAPARIN SODIUM 40 MG/0.4ML ~~LOC~~ SOLN
40.0000 mg | Freq: Every day | SUBCUTANEOUS | Status: DC
  Administered 2019-01-18 – 2019-01-19 (×2): 40 mg via SUBCUTANEOUS
  Filled 2019-01-18 (×2): qty 0.4

## 2019-01-18 MED ORDER — CIPROFLOXACIN HCL 500 MG PO TABS
500.0000 mg | ORAL_TABLET | Freq: Two times a day (BID) | ORAL | Status: DC
  Filled 2019-01-18: qty 1

## 2019-01-18 MED ORDER — CIPROFLOXACIN IN D5W 400 MG/200ML IV SOLN
400.0000 mg | Freq: Two times a day (BID) | INTRAVENOUS | Status: DC
  Administered 2019-01-18 – 2019-01-19 (×3): 400 mg via INTRAVENOUS
  Filled 2019-01-18 (×3): qty 200

## 2019-01-18 MED ORDER — AMLODIPINE BESYLATE 10 MG PO TABS: 10.0000 mg | ORAL_TABLET | Freq: Every day | ORAL | 0 refills | Status: AC

## 2019-01-18 NOTE — Consult Note (Signed)
Urology Consult Note   Requesting Attending Physician:  Kayleen Memos, DO Service Providing Consult: Urology  Consulting Attending: Irine Seal, MD   Reason for Consult:  UTI  HPI: Willie Franco is seen in consultation for reasons noted above at the request of Kayleen Memos, DO.  This is a 68 y.o. male with past medical history as detailed below who was admitted 2/12 with fevers and left lower quadrant pain with work up consistent with UTI. 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 He was started on broad spectrum antibiotics with clinical improvement. UCx returned 80K CFU Morganella morganii with multidrug resistance. BCx no growth. He is currently on IV ciprofloxacin.  Regarding patient's other past urologic history he reports a remote history of microscopic hematuria with prior negative malignancy work up, nephrolithiasis, elevated PSA (5.13 on 01/20/18 and 5.27 on 04/21/18), and BPH well managed with Flomax. He is followed by his local urologist at [facility], Dr. Rosana Hoes.  Today, patient denies hematuria, dysuria, urgency/frequency, incomplete emptying, or slow flow. No rectal/perineal pain. No fevers/chills or nausea/vomiting. Left lower quadrant pain has resolved. He reports a similar admission in 2015, making this his second hospitalization for UTI during his adulthood.   Past Medical History: Past Medical History:  Diagnosis Date  . Arthritis   . GERD (gastroesophageal reflux disease)   . Hypertension   . 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 Sleep apnea    no CPAP at present    Past Surgical History:  Past Surgical History:  Procedure Laterality Date  . COLONOSCOPY WITH PROPOFOL N/A 07/14/2013   Procedure: COLONOSCOPY WITH PROPOFOL;  Surgeon: Garlan Fair, MD;  Location: [facility];  Service: Endoscopy;  Laterality: N/A;  . JOINT  REPLACEMENT    . TONSILLECTOMY     age 76  . 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 0.9 %  sodium chloride infusion  250 mL Intravenous PRN Derrill Kay A, MD      . acetaminophen (TYLENOL) tablet 1,000 mg  1,000 mg Oral Q6H PRN Phillips Grout, MD   1,000 mg at 01/18/19 1244  . amLODipine (NORVASC) tablet 10 mg  10 mg Oral Daily Swayze, Ava, DO   10 mg at 01/18/19 0918  . atorvastatin (LIPITOR) tablet 80 mg  80 mg Oral Daily Swayze, Ava, DO   80 mg at 01/18/19 0917  . cholecalciferol (VITAMIN D3) tablet 2,000 Units  2,000 Units Oral Daily Kayleen Memos, DO   2,000 Units at 01/18/19 6629  . ciprofloxacin (CIPRO) IVPB 400 mg  400 mg Intravenous Q12H Hall, Carole N, DO 200 mL/hr at 01/18/19 1058 400 mg at 01/18/19 1058  . enoxaparin (LOVENOX) injection 40 mg  40 mg Subcutaneous Daily Irene Pap N, DO   40 mg at 01/18/19 1245  . insulin aspart (novoLOG) injection 0-9 Units  0-9 Units Subcutaneous TID WC Irene Pap N, DO   1 Units at 01/18/19 1802  . ondansetron (ZOFRAN) tablet 4 mg  4 mg Oral Q6H PRN Phillips Grout, MD       Or  . ondansetron (ZOFRAN) injection 4 mg  4 mg Intravenous Q6H PRN Derrill Kay A, MD      . sodium chloride flush (NS)  0.9 % injection 3 mL  3 mL Intravenous Q12H Derrill Kay A, MD   3 mL at 01/18/19 1108  . sodium chloride flush (NS) 0.9 % injection 3 mL  3 mL Intravenous PRN Derrill Kay A, MD      . tamsulosin [facility]) capsule 0.4 mg  0.4 mg Oral Daily Irene Pap N, DO   0.4 mg at 01/18/19 2376    Allergies: No Known Allergies  Social History: Social History   Tobacco Use  . Smoking status: Never Smoker  . Smokeless tobacco: Never Used  Substance Use Topics  . Alcohol use: No  . Drug use: No    Family History History reviewed. No pertinent family history.  Review of Systems 10 systems were reviewed and are negative except  as noted specifically in the HPI.  Objective   Vital signs in last 24 hours: BP 123/72 (BP Location: Right Arm)   Pulse 77   Temp 98.1 F (36.7 C) (Oral)   Resp 16   Ht 5\' 10"  (1.778 m)   Wt 118 kg   SpO2 94%   BMI 37.33 kg/m   Physical Exam General: NAD, A&O, resting, appropriate HEENT: /AT, EOMI, MMM Pulmonary: Normal work of breathing Cardiovascular: HDS, adequate peripheral perfusion Abdomen: Soft, NTTP, nondistended. GU: No CVAT Extremities: warm and well perfused Neuro: Appropriate, no focal neurological deficits  Most Recent Labs: Lab Results  Component Value Date   WBC 12.3 (H) 01/18/2019   HGB 14.1 01/18/2019   HCT 43.1 01/18/2019   PLT 282 01/18/2019    Lab Results  Component Value Date   NA 137 01/18/2019   K 4.2 01/18/2019   CL 105 01/18/2019   CO2 24 01/18/2019   BUN 18 01/18/2019   CREATININE 1.03 01/18/2019   CALCIUM 8.7 (L) 01/18/2019    No results found for: INR, APTT   IMAGING: Mr Pelvis Wo Contrast  Result Date: 01/17/2019 CLINICAL DATA:  Question inflamed seminal vesicles, prostatitis. EXAM: MRI ABDOMEN AND PELVIS WITHOUT CONTRAST TECHNIQUE: Multiplanar multisequence MR imaging of the abdomen and pelvis was performed. No intravenous contrast was administered. COMPARISON:  CT 01/14/2019 FINDINGS: COMBINED FINDINGS FOR BOTH MR ABDOMEN AND PELVIS Lower chest: The lung fields were not imaged. Hepatobiliary: Partial imaging of the liver on limited sequences unremarkable. Gallbladder appears normal. Distal common bile duct is noted. Normal. Pancreas:  Pancreas partially imaged unremarkable. Spleen:  Partially imaged normal Adrenals/Urinary Tract: The LEFT kidney is atrophic. There is hydroureter on the LEFT. The RIGHT kidney is hypertrophied. No hydronephrosis. No clear abnormality on MRI imaging without contrast. Imaging of the bladder is degraded by bilateral hip prosthetics. Is cast significant artifact through the bladder on all sequences.  Stomach/Bowel: Limited view bowel unremarkable.  Rectum not evaluate Vascular/Lymphatic: Abdominal aorta normal. Small periaortic lymph nodes about the distal aorta (image 27/16) similar comparison CT. Largest measures up to 1.5 cm. Reproductive: Prostate not evaluated due to significant artifact from the hip prosthetics. Other:  Unremarkable Musculoskeletal: Normal IMPRESSION: 1. Inferior pelvis is not adequately evaluated as there is significant metal artifact generated from the bilateral hip prosthetics. The seminal vesicles,prostate, and bladder not evaluable . 2. Atrophic LEFT kidney and hypertrophy of the RIGHT kidney. No clear acute findings. 3. Mild periaortic lymphadenopathy as seen on comparison CT. Electronically Signed   By: Suzy Bouchard M.D.   On: 01/17/2019 20:41   Mr Abdomen Wo Contrast  Result Date: 01/17/2019 CLINICAL DATA:  Question inflamed seminal vesicles, prostatitis. EXAM: MRI ABDOMEN AND PELVIS  WITHOUT CONTRAST TECHNIQUE: Multiplanar multisequence MR imaging of the abdomen and pelvis was performed. No intravenous contrast was administered. COMPARISON:  CT 01/14/2019 FINDINGS: COMBINED FINDINGS FOR BOTH MR ABDOMEN AND PELVIS Lower chest: The lung fields were not imaged. Hepatobiliary: Partial imaging of the liver on limited sequences unremarkable. Gallbladder appears normal. Distal common bile duct is noted. Normal. Pancreas:  Pancreas partially imaged unremarkable. Spleen:  Partially imaged normal Adrenals/Urinary Tract: The LEFT kidney is atrophic. There is hydroureter on the LEFT. The RIGHT kidney is hypertrophied. No hydronephrosis. No clear abnormality on MRI imaging without contrast. Imaging of the bladder is degraded by bilateral hip prosthetics. Is cast significant artifact through the bladder on all sequences. Stomach/Bowel: Limited view bowel unremarkable.  Rectum not evaluate Vascular/Lymphatic: Abdominal aorta normal. Small periaortic lymph nodes about the distal aorta  (image 27/16) similar comparison CT. Largest measures up to 1.5 cm. Reproductive: Prostate not evaluated due to significant artifact from the hip prosthetics. Other:  Unremarkable Musculoskeletal: Normal IMPRESSION: 1. Inferior pelvis is not adequately evaluated as there is significant metal artifact generated from the bilateral hip prosthetics. The seminal vesicles,prostate, and bladder not evaluable . 2. Atrophic LEFT kidney and hypertrophy of the RIGHT kidney. No clear acute findings. 3. Mild periaortic lymphadenopathy as seen on comparison CT. Electronically Signed   By: Suzy Bouchard M.D.   On: 01/17/2019 20:41    ------  Assessment:  68 y.o. 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 Patient admitted 2/12 with fevers and left lower quadrant pain. Workup consistent with UTI. He was treated with IV antibiotics and his clinically condition has improved.  Patient currently afebrile and HDS. Non-toxic appearing. UCx returned 80K CFU Morganella morganii with multidrug resistance. BCx no growth. He is currently on IV ciprofloxacin based on culture sensitivities. WBC 12.3 today. Cr WNL. Imaging as described above. Left lower quadrant symptoms resolved. Denies any bothersome LUTS.   Recommendations: - Agree with continuation of antibiotics based on urine culture sensitivities, would recommend total of 2 week treatment course. - Recommend patient follow up as outpatient for consideration of further intervention regarding atrophic left kidney with chronically dilated left ureter in setting of known congenital left ureterocele, which remains the most likely source of his current and past infections. Options would include no intervention versus consideration of endoscopic management with ureterocele  unroofing versus a more aggressive approach with left nephroureterectomy. Patient has previously scheduled follow up with his local urologist with [facility] the week of 2/24 and will plan to discuss his options at that visit. - Regarding history of elevated PSA, will defer further management decisions to patient and his local urologist. - Continue Flomax for BPH. - Regarding CT findings of scattered retroperitoneal and iliac lymph nodes, agree with oncology recommendations to completing course of antibiotics and repeat imaging in 6 months.   Thank you for this consult. Please contact the urology consult pager with any further questions/concerns. We will sign off at this time.

## 2019-01-18 NOTE — Progress Notes (Signed)
Pharmacy Antibiotic Note  Willie Franco is a 68 y.o. male admitted on 01/14/2019 with UTI.  Pharmacy has been consulted for ciprofloxacin dosing.  Plan: Ciprofloxacin 400 mg IV q12h  Height: 5\' 10"  (177.8 cm) Weight: 260 lb 3.2 oz (118 kg) IBW/kg (Calculated) : 73  Temp (24hrs), Avg:98.3 F (36.8 C), Min:98 F (36.7 C), Max:98.7 F (37.1 C)  Recent Labs  Lab 01/14/19 2209 01/16/19 0426 01/17/19 0434 01/18/19 0657  WBC 11.5* 11.6* 11.6* 12.3*  CREATININE 1.22 1.15 1.09 1.03  LATICACIDVEN 1.5  --   --   --     Estimated Creatinine Clearance: 90.8 mL/min (by C-G formula based on SCr of 1.03 mg/dL).    No Known Allergies  Antimicrobials this admission: 2/13 ceftriaxone >> 2/16 2/16 ciprofloxacin >>   Dose adjustments this admission:   Microbiology results: 2/12 BCx: NGTD 2/12 UCx: Morganella morganii ( R to  Ampicillin, Unasyn, cefazolin, gentamicin, nitofurantoin, Septa, S to ceftriaxone, ciprofloxacin, imipenem, Zosyn)     Thank you for allowing pharmacy to be a part of this patient's care.   Royetta Asal, PharmD, BCPS Pager [phone] 01/18/2019 9:35 AM

## 2019-01-18 NOTE — Progress Notes (Signed)
PT Cancellation Note  Patient Details Name: Willie Franco MRN: [id number] DOB: [birth year]   Cancelled Treatment:    Reason Eval/Treat Not Completed: PT screened, no needs identified, will sign off. Spoke with pt and wife who report he has been up walking the halls.  They both deny any unsteadiness.  Will sign off and they are in agreement.   Galen Manila 01/18/2019, 9:37 AM

## 2019-01-18 NOTE — Discharge Instructions (Signed)
Pyelonephritis, Adult    Pyelonephritis is a kidney infection. The kidneys are organs that help clean your blood by moving waste out of your blood and into your pee (urine). This infection can happen quickly, or it can last for a long time. In most cases, it clears up with treatment and does not cause other problems.  Follow these instructions at home:  Medicines  · Take over-the-counter and prescription medicines only as told by your doctor.  · Take your antibiotic medicine as told by your doctor. Do not stop taking the medicine even if you start to feel better.  General instructions  · Drink enough fluid to keep your pee clear or pale yellow.  · Avoid caffeine, tea, and carbonated drinks.  · Pee (urinate) often. Avoid holding in pee for long periods of time.  · Pee before and after sex.  · After pooping (having a bowel movement), women should wipe from front to back. Use each tissue only once.  · Keep all follow-up visits as told by your doctor. This is important.  Contact a doctor if:  · You do not feel better after 2 days.  · Your symptoms get worse.  · You have a fever.  Get help right away if:  · You cannot take your medicine or drink fluids as told.  · You have chills and shaking.  · You throw up (vomit).  · You have very bad pain in your side (flank) or back.  · You feel very weak or you pass out (faint).  This information is not intended to replace advice given to you by your health care provider. Make sure you discuss any questions you have with your health care provider.  Document Released: 12/27/2004 Document Revised: 04/26/2016 Document Reviewed: 03/14/2015  Elsevier Interactive Patient Education © 2019 [employer]

## 2019-01-18 NOTE — Progress Notes (Signed)
PROGRESS NOTE  RAI SEVERNS QQV:[id number] DOB: 01-05-1951 DOA: 01/14/2019 PCP: Kristopher Glee., MD  HPI/Recap of past 24 hours: Willie Guild Butleris a 68 y.o.malewith medical history significant ofcongenital horseshoe right kidney, hypertension comes in with over 1 day of right lower quadrant discomfort that radiating from his right flank. He describes it not really as pain but just a discomfort feeling. He started running fever last night of 101. He has had no nausea vomiting or diarrhea. He denies any dysuria or hematuria. He denies any upper respiratory tract symptoms or any flulike symptoms. He does not frequently get UTIs and is not on antibiotics frequently. His last hospitalization that was kidney related was 5 years ago. Patient is being referred for admission for pyelonephritis.  01/16/2019: Continues to have discomfort in his right flank.  Last fever recorded on 01/15/2019 at 6:32 AM with T-max of 101.3.   01/17/2019: Discussed with Dr. Earlie Server from heme oncology regarding incidental findings on CT abdomen and pelvis- new scattered retroperitoneal and iliac lymph nodes.  He recommended completing course of antibiotics and to repeat imaging in 6 months to see if they have resolved.  01/18/2019: Patient seen and examined with his wife who is also NP at bedside.  Reports persistent left lower quadrant and suprapubic discomfort.  Worsening leukocytosis is noted this morning.  Afebrile.  Discussed with urology on-call, Dr. Jaclynn Guarneri Clydene Laming, who will see the patient in consultation.  Patient is being followed by urology at [facility]  Restarted IV antibiotics.   Assessment/Plan: Principal Problem:   Pyelonephritis Active Problems:   GERD (gastroesophageal reflux disease)   Congenital malformation of kidney  Acute pyelonephritis in the setting of single functional kidney due to congenital malformation Presented with fever with T-max of 101.3 and mild perinephric stranding involving  the right kidney UA positive for pyuria Urine culture grew 80,000 colonies of Morganela morganii sensitive to Zosyn, ciprofloxacin, and Rocephin 2/13 ceftriaxone >> 2/16 2/16 IV ciprofloxacin >>  Blood cultures x2- to date Obtain CBC with differential tomorrow Urology on-call consulted 01/18/2019, Dr. Jaclynn Guarneri Clydene Laming will see the patient.  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 Earlie Server of heme oncology who recommended finishing course of antibiotics and repeating imaging in 6 months  BPH with abnormalities noted on CT abd pelvis w contrast Continue tamsulosin Continue to monitor U/O Possible seminal vesicles inflammatory changes; localized infection could not be totally excluded MRI abd pelvis without contrast unrevealing Urology on call consulted, Dr. Case Clydene Laming will see the patient  History of bilateral total hip replacement  Noted  GERD Continue Prilosec  Hypertension Blood pressure is at lower side of normal Continue Norvasc 10 mg daily Continue to hold losartan, defer to PCP to restart  Hyperlipidemia Continue Lipitor 80 mg daily  Type 2 diabetes On metformin at home, hold metformin in the inpatient setting Insulin sensitive insulin sliding scale A1c 6.6 on 01/16/2019    DVT prophylaxis: SCD's and subcu Lovenox daily Code Status: Full Code Family Communication: Emergency contact Jerrie Gullo ( Spouse) [phone].  Updated wife at bedside who is also NP. Disposition Plan:  Home in 1 to 2 days when leukocytosis has resolved or when urology signs of.     Antibiotics  Rocephin    Objective: Vitals:   01/17/19 1000 01/17/19 1414  01/17/19 2055 01/18/19 0635  BP:  124/76 134/72 124/76  Pulse:  76 78 71  Resp:  16 18 18   Temp: 98.5 F (36.9 C) 98.7 F (37.1 C) 98.1 F (36.7 C) 98 F (36.7 C)  TempSrc: Oral Oral Oral Oral  SpO2:  98% 95% 94%  Weight:      Height:        Intake/Output Summary (Last 24 hours) at 01/18/2019 1159 Last data filed at 01/18/2019 1108 Gross per 24 hour  Intake 2137.9 ml  Output 1550 ml  Net 587.9 ml   Filed Weights   01/14/19 2127  Weight: 118 kg    Exam:  . General: 68 y.o. year-old male well-developed well-nourished appears uncomfortable due to suprapubic discomfort.  Alert and oriented x3. . Cardiovascular: Regular rate and rhythm with no rubs or gallops.  No JVD or thyromegaly . Respiratory: Clear to station with no wheezes or rales.  Good inspiratory effort. . Abdomen: Soft nontender nondistended with normal bowel sounds x4 quadrants.  Mild discomfort right flank. . Musculoskeletal: No lower extremity edema. 2/4 pulses in all 4 extremities. . Skin: No ulcerative lesions noted or rashes, . Psychiatry: Mood is appropriate for condition and setting   Data Reviewed: CBC: Recent Labs  Lab 01/14/19 2209 01/16/19 0426 01/17/19 0434 01/18/19 0657  WBC 11.5* 11.6* 11.6* 12.3*  NEUTROABS 9.1* 7.7 7.1 8.0*  HGB 13.9 14.0 13.9 14.1  HCT 42.8 43.3 41.7 43.1  MCV 91.1 89.8 88.0 87.6  PLT 279 248 242 048   Basic Metabolic Panel: Recent Labs  Lab 01/14/19 2209 01/16/19 0426 01/17/19 0434 01/18/19 0657  NA 136 135 135 137  K 3.9 4.3 3.8 4.2  CL 106 103 105 105  CO2 22 23 23 24   GLUCOSE 166* 119* 137* 115*  BUN 16 12 12 18   CREATININE 1.22 1.15 1.09 1.03  CALCIUM 8.8* 8.4* 8.1* 8.7*   GFR: Estimated Creatinine Clearance: 90.8 mL/min (by C-G formula based on SCr of 1.03 mg/dL). Liver Function Tests: Recent Labs  Lab 01/14/19 2209 01/17/19 0434  AST 18 22  ALT 15 24  ALKPHOS 71 53  BILITOT 0.8 0.5  PROT 7.1 6.2*  ALBUMIN 3.9 3.2*   No results for  input(s): LIPASE, AMYLASE in the last 168 hours. No results for input(s): AMMONIA in the last 168 hours. Coagulation Profile: No results for input(s): INR, PROTIME in the last 168 hours. Cardiac Enzymes: No results for input(s): CKTOTAL, CKMB, CKMBINDEX, TROPONINI in the last 168 hours. BNP (last 3 results) No results for input(s): PROBNP in the last 8760 hours. HbA1C: Recent Labs    01/16/19 0426  HGBA1C 6.6*   CBG: Recent Labs  Lab 01/17/19 1221 01/17/19 1640 01/17/19 2227 01/18/19 0759 01/18/19 1137  GLUCAP 134* 105* 123* 103* 151*   Lipid Profile: No results for input(s): CHOL, HDL, LDLCALC, TRIG, CHOLHDL, LDLDIRECT in the last 72 hours. Thyroid Function Tests: No results for input(s): TSH, T4TOTAL, FREET4, T3FREE, THYROIDAB in the last 72 hours. Anemia Panel: No results for input(s): VITAMINB12, FOLATE, FERRITIN, TIBC, IRON, RETICCTPCT in the last 72 hours. Urine analysis:    Component Value Date/Time   COLORURINE YELLOW 01/14/2019 2209   APPEARANCEUR CLOUDY (A) 01/14/2019 2209   LABSPEC 1.023 01/14/2019 2209   PHURINE 5.0 01/14/2019 2209   GLUCOSEU NEGATIVE 01/14/2019 2209   HGBUR LARGE (A) 01/14/2019 2209   BILIRUBINUR NEGATIVE 01/14/2019 2209   Benjamin Stain  NEGATIVE 01/14/2019 2209   PROTEINUR 30 (A) 01/14/2019 2209   UROBILINOGEN 1.0 10/25/2014 2204   NITRITE NEGATIVE 01/14/2019 2209   LEUKOCYTESUR LARGE (A) 01/14/2019 2209   Sepsis Labs: @LABRCNTIP (procalcitonin:4,lacticidven:4)  ) Recent Results (from the past 240 hour(s))  Urine culture     Status: Abnormal   Collection Time: 01/14/19 10:13 PM  Result Value Ref Range Status   Specimen Description   Final    URINE, CLEAN CATCH Performed at [facility], [place] [address] [place], [place] [address]    Special Requests   Final    NONE Performed at [facility], [place] [address], [place], [place] [address]    Culture 80,000 COLONIES/mL [facility] MORGANII (A)  Final     Report Status 01/17/2019 FINAL  Final   Organism ID, Bacteria MORGANELLA MORGANII (A)  Final      Susceptibility   Morganella morganii - MIC*    AMPICILLIN >=32 RESISTANT Resistant     CEFAZOLIN >=64 RESISTANT Resistant     CEFTRIAXONE <=1 SENSITIVE Sensitive     CIPROFLOXACIN 1 SENSITIVE Sensitive     GENTAMICIN >=16 RESISTANT Resistant     IMIPENEM 1 SENSITIVE Sensitive     NITROFURANTOIN 128 RESISTANT Resistant     TRIMETH/SULFA >=320 RESISTANT Resistant     AMPICILLIN/SULBACTAM >=32 RESISTANT Resistant     PIP/TAZO <=4 SENSITIVE Sensitive     * 80,000 COLONIES/mL MORGANELLA MORGANII  Blood culture (routine x 2)     Status: None (Preliminary result)   Collection Time: 01/14/19 10:14 PM  Result Value Ref Range Status   Specimen Description   Final    BLOOD RIGHT ANTECUBITAL Performed at [facility], [place] [address] [place], [place] [address]    Special Requests   Final    BOTTLES DRAWN AEROBIC AND ANAEROBIC Blood Culture results may not be optimal due to an excessive volume of blood received in culture bottles Performed at [place] [address] [place], [place] [address]    Culture   Final    NO GROWTH 3 DAYS Performed at [facility], [place] [address], [place], [place] [address]    Report Status PENDING  Incomplete  Blood culture (routine x 2)     Status: None (Preliminary result)   Collection Time: 01/14/19 10:45 PM  Result Value Ref Range Status   Specimen Description   Final    BLOOD LEFT ANTECUBITAL Performed at [place] [address], [place], [place] [address]    Special Requests   Final    BOTTLES DRAWN AEROBIC AND ANAEROBIC Blood Culture adequate volume Performed at [place] [address], [place], [place] [address]    Culture   Final    NO GROWTH 3 DAYS Performed at [facility], [place] [address], [place], [place] [address]    Report Status PENDING   Incomplete      Studies: Mr Pelvis Wo Contrast  Result Date: 01/17/2019 CLINICAL DATA:  Question inflamed seminal vesicles, prostatitis. EXAM: MRI ABDOMEN AND PELVIS WITHOUT CONTRAST TECHNIQUE: Multiplanar multisequence MR imaging of the abdomen and pelvis was performed. No intravenous contrast was administered. COMPARISON:  CT 01/14/2019 FINDINGS: COMBINED FINDINGS FOR BOTH MR ABDOMEN AND PELVIS Lower chest: The lung fields were not imaged. Hepatobiliary: Partial imaging of the liver on limited sequences unremarkable. Gallbladder appears normal. Distal common bile duct is noted. Normal. Pancreas:  Pancreas partially imaged unremarkable. Spleen:  Partially  imaged normal Adrenals/Urinary Tract: The LEFT kidney is atrophic. There is hydroureter on the LEFT. The RIGHT kidney is hypertrophied. No hydronephrosis. No clear abnormality on MRI imaging without contrast. Imaging of the bladder is degraded by bilateral hip prosthetics. Is cast significant artifact through the bladder on all sequences. Stomach/Bowel: Limited view bowel unremarkable.  Rectum not evaluate Vascular/Lymphatic: Abdominal aorta normal. Small periaortic lymph nodes about the distal aorta (image 27/16) similar comparison CT. Largest measures up to 1.5 cm. Reproductive: Prostate not evaluated due to significant artifact from the hip prosthetics. Other:  Unremarkable Musculoskeletal: Normal IMPRESSION: 1. Inferior pelvis is not adequately evaluated as there is significant metal artifact generated from the bilateral hip prosthetics. The seminal vesicles,prostate, and bladder not evaluable . 2. Atrophic LEFT kidney and hypertrophy of the RIGHT kidney. No clear acute findings. 3. Mild periaortic lymphadenopathy as seen on comparison CT. Electronically Signed   By: Suzy Bouchard M.D.   On: 01/17/2019 20:41   Mr Abdomen Wo Contrast  Result Date: 01/17/2019 CLINICAL DATA:  Question inflamed seminal vesicles, prostatitis. EXAM: MRI ABDOMEN AND  PELVIS WITHOUT CONTRAST TECHNIQUE: Multiplanar multisequence MR imaging of the abdomen and pelvis was performed. No intravenous contrast was administered. COMPARISON:  CT 01/14/2019 FINDINGS: COMBINED FINDINGS FOR BOTH MR ABDOMEN AND PELVIS Lower chest: The lung fields were not imaged. Hepatobiliary: Partial imaging of the liver on limited sequences unremarkable. Gallbladder appears normal. Distal common bile duct is noted. Normal. Pancreas:  Pancreas partially imaged unremarkable. Spleen:  Partially imaged normal Adrenals/Urinary Tract: The LEFT kidney is atrophic. There is hydroureter on the LEFT. The RIGHT kidney is hypertrophied. No hydronephrosis. No clear abnormality on MRI imaging without contrast. Imaging of the bladder is degraded by bilateral hip prosthetics. Is cast significant artifact through the bladder on all sequences. Stomach/Bowel: Limited view bowel unremarkable.  Rectum not evaluate Vascular/Lymphatic: Abdominal aorta normal. Small periaortic lymph nodes about the distal aorta (image 27/16) similar comparison CT. Largest measures up to 1.5 cm. Reproductive: Prostate not evaluated due to significant artifact from the hip prosthetics. Other:  Unremarkable Musculoskeletal: Normal IMPRESSION: 1. Inferior pelvis is not adequately evaluated as there is significant metal artifact generated from the bilateral hip prosthetics. The seminal vesicles,prostate, and bladder not evaluable . 2. Atrophic LEFT kidney and hypertrophy of the RIGHT kidney. No clear acute findings. 3. Mild periaortic lymphadenopathy as seen on comparison CT. Electronically Signed   By: Suzy Bouchard M.D.   On: 01/17/2019 20:41    Scheduled Meds: . amLODipine  10 mg Oral Daily  . atorvastatin  80 mg Oral Daily  . cholecalciferol  2,000 Units Oral Daily  . insulin aspart  0-9 Units Subcutaneous TID WC  . sodium chloride flush  3 mL Intravenous Q12H  . tamsulosin  0.4 mg Oral Daily    Continuous Infusions: . sodium  chloride    . ciprofloxacin 400 mg (01/18/19 1058)     LOS: 3 days     Kayleen Memos, MD [facility] Pager [phone]  If 7PM-7AM, please contact night-coverage [email or web address] Password [email or web address] 01/18/2019, 11:59 AM

## 2019-01-19 LAB — GLUCOSE, CAPILLARY: Glucose-Capillary: 105 mg/dL — ABNORMAL HIGH (ref 70–99)

## 2019-01-19 MED ORDER — CIPROFLOXACIN HCL 500 MG PO TABS: 500.0000 mg | ORAL_TABLET | Freq: Two times a day (BID) | ORAL | 0 refills | Status: AC

## 2019-01-19 NOTE — Progress Notes (Signed)
Pt alert and oriented, tolerating diet. D/C instructions given, pt d/cd home. 

## 2019-01-19 NOTE — Discharge Summary (Signed)
Physician Discharge Summary  KATHLEEN LIKINS VQQ:[id number] DOB: 02/25/1951 DOA: 01/14/2019  PCP: Kristopher Glee., MD  Admit date: 01/14/2019 Discharge date: 01/19/2019  Time spent: 35 minutes  Recommendations for Outpatient Follow-up:  1. Complete oral antibiotics with ciprofloxacin total of 14 days prescription given 2. Needs outpatient further urological work-up and might need surgical intervention for atrophic left kidney defer to Dr. Rosana Hoes at [facility] patient aware to keep follow-up appointment 2/24 3. Recommend outpatient consideration for CT scan abdomen pelvis with contrast with regards to lymphadenopathy that was found coincidentally-this was felt to be reactive in nature originally but will need follow-up regardless  Discharge Diagnoses:  Principal Problem:   Pyelonephritis Active Problems:   GERD (gastroesophageal reflux disease)   Congenital malformation of kidney   Discharge Condition: Improved  Diet recommendation: Diabetic heart healthy  Filed Weights   01/14/19 2127  Weight: 118 kg    History of present illness:  68 year old obese Caucasian male Congenital horseshoe kidney, HTN,, BPH, elevated PSA in the past in the 5 range 2019 Admitted with sepsis consistent with UTI   Hospital Course:  Pyelonephritis in setting of single kidney- Initial fever 101, urine grew Morganella morganii Zosyn was narrowed eventually ceftriaxone and on discharge p.o. Cipro blood cultures were negative Patient was sent home to complete 2 weeks of oral antibiotics with ciprofloxacin and will need outpatient follow-up with nephrology  Congenital horseshoe kidney Passing good urine Urology saw the patient-recommended that with atrophic left kidney and chronically dilated left ureter this is probably a source for infection and options would be no intervention versus ureterocele unroofing versus nephrectomy-he will discuss this as an outpatient with his urologist at [facility]  2/24  Elevated PSA in the past at 5 Needs repeat and checks  Retroperitoneal iliac lymph node enlargements Needs repeat imaging in about 6 months with contrast  Moderately controlled diabetes mellitus A1c 6.6 blood sugars controlled 100-1 50 range  Procedures:  CT abdomen pelvis performed on 2/12 IMPRESSION: Chronic changes involving the left kidney.  Mild inflammatory changes involving the seminal vesicles. These are somewhat enlarged. The size is stable from the prior CT examination although the degree of surrounding inflammatory change is new.  Scattered retroperitoneal and iliac chain lymph nodes. These are new from the prior exam and of uncertain significance. They may be reactive secondary to inflammatory change although a more aggressive process such as lymphoma could not be totally excluded. Further workup would be helpful.  Mild perinephric stranding involving the right kidney. No obstructing stone is seen. Possibility of underlying inflammatory change deserves consideration. Formed on Consultations:  Urology Dr. Clydene Laming, Dr. Jeffie Pollock  Discharge Exam: Vitals:   01/18/19 2128 01/19/19 0548  BP: 133/68 118/77  Pulse: 74 75  Resp: 16 16  Temp: 98.7 F (37.1 C) 98.5 F (36.9 C)  SpO2: 94% 95%    General: Awake alert coherent no distress Cardiovascular: S1-S2 no murmur Respiratory: Chest is clear Abdomen is soft nontender no rebound no guarding He has no lower extremity edema Power is 5/5 grossly euthymic and pleasant  Discharge Instructions   Discharge Instructions    Diet - low sodium heart healthy   Complete by:  As directed    Discharge instructions   Complete by:  As directed    Complete as an outpatient course of ciprofloxacin for your urinary infection You will need outpatient management of your kidney issues with a urologist and I would encourage you to follow-up with whoever you are established with  Would recommend lab work to check your  kidneys in about 1 week   Increase activity slowly   Complete by:  As directed      Allergies as of 01/19/2019   No Known Allergies     Medication List    STOP taking these medications   acetaminophen 500 MG tablet Commonly known as:  TYLENOL   ibuprofen 200 MG tablet Commonly known as:  ADVIL,MOTRIN   losartan 50 MG tablet Commonly known as:  COZAAR     TAKE these medications   amLODipine 10 MG tablet Commonly known as:  NORVASC Take 1 tablet (10 mg total) by mouth daily.   atorvastatin 80 MG tablet Commonly known as:  LIPITOR Take 80 mg by mouth daily.   cholecalciferol 25 MCG (1000 UT) tablet Commonly known as:  VITAMIN D3 Take 2,000 Units by mouth daily.   ciprofloxacin 500 MG tablet Commonly known as:  CIPRO Take 1 tablet (500 mg total) by mouth 2 (two) times daily for 10 days.   MAXIMUM RED KRILL PO Take 1 capsule by mouth daily.   metFORMIN 500 MG tablet Commonly known as:  GLUCOPHAGE Take 500 mg by mouth daily.   tamsulosin 0.4 MG Caps capsule Commonly known as:  FLOMAX Take 1 capsule (0.4 mg total) by mouth daily.      No Known Allergies Follow-up Information    Kristopher Glee., MD. Call in 1 day(s).   Specialty:  Internal Medicine Why:  Please call for a post hospital follow-up appointment. Contact information: [address] [place] [address] [phone]        Myrlene Broker, MD. Call in 1 day(s).   Specialty:  Urology Why:  Please call for a post hospital follow-up appointment. Contact information: [place] [address] [phone]            The results of significant diagnostics from this hospitalization (including imaging, microbiology, ancillary and laboratory) are listed below for reference.    Significant Diagnostic Studies: Dg Chest 2 View  Result Date: 01/14/2019 CLINICAL DATA:  Fevers EXAM: CHEST - 2 VIEW COMPARISON:  None. FINDINGS: The heart size and mediastinal contours are  within normal limits. Both lungs are clear. The visualized skeletal structures are unremarkable. IMPRESSION: No active cardiopulmonary disease. Electronically Signed   By: Inez Catalina M.D.   On: 01/14/2019 21:47   Mr Pelvis Wo Contrast  Result Date: 01/17/2019 CLINICAL DATA:  Question inflamed seminal vesicles, prostatitis. EXAM: MRI ABDOMEN AND PELVIS WITHOUT CONTRAST TECHNIQUE: Multiplanar multisequence MR imaging of the abdomen and pelvis was performed. No intravenous contrast was administered. COMPARISON:  CT 01/14/2019 FINDINGS: COMBINED FINDINGS FOR BOTH MR ABDOMEN AND PELVIS Lower chest: The lung fields were not imaged. Hepatobiliary: Partial imaging of the liver on limited sequences unremarkable. Gallbladder appears normal. Distal common bile duct is noted. Normal. Pancreas:  Pancreas partially imaged unremarkable. Spleen:  Partially imaged normal Adrenals/Urinary Tract: The LEFT kidney is atrophic. There is hydroureter on the LEFT. The RIGHT kidney is hypertrophied. No hydronephrosis. No clear abnormality on MRI imaging without contrast. Imaging of the bladder is degraded by bilateral hip prosthetics. Is cast significant artifact through the bladder on all sequences. Stomach/Bowel: Limited view bowel unremarkable.  Rectum not evaluate Vascular/Lymphatic: Abdominal aorta normal. Small periaortic lymph nodes about the distal aorta (image 27/16) similar comparison CT. Largest measures up to 1.5 cm. Reproductive: Prostate not evaluated due to significant artifact from the hip prosthetics. Other:  Unremarkable Musculoskeletal: Normal IMPRESSION: 1. Inferior pelvis is not adequately evaluated as there is significant metal artifact generated from the bilateral hip prosthetics. The seminal vesicles,prostate, and bladder not evaluable . 2. Atrophic LEFT kidney and hypertrophy of the RIGHT kidney. No clear acute findings. 3. Mild periaortic lymphadenopathy as seen on comparison CT. Electronically Signed   By:  Suzy Bouchard M.D.   On: 01/17/2019 20:41   Mr Abdomen Wo Contrast  Result Date: 01/17/2019 CLINICAL DATA:  Question inflamed seminal vesicles, prostatitis. EXAM: MRI ABDOMEN AND PELVIS WITHOUT CONTRAST TECHNIQUE: Multiplanar multisequence MR imaging of the abdomen and pelvis was performed. No intravenous contrast was administered. COMPARISON:  CT 01/14/2019 FINDINGS: COMBINED FINDINGS FOR BOTH MR ABDOMEN AND PELVIS Lower chest: The lung fields were not imaged. Hepatobiliary: Partial imaging of the liver on limited sequences unremarkable. Gallbladder appears normal. Distal common bile duct is noted. Normal. Pancreas:  Pancreas partially imaged unremarkable. Spleen:  Partially imaged normal Adrenals/Urinary Tract: The LEFT kidney is atrophic. There is hydroureter on the LEFT. The RIGHT kidney is hypertrophied. No hydronephrosis. No clear abnormality on MRI imaging without contrast. Imaging of the bladder is degraded by bilateral hip prosthetics. Is cast significant artifact through the bladder on all sequences. Stomach/Bowel: Limited view bowel unremarkable.  Rectum not evaluate Vascular/Lymphatic: Abdominal aorta normal. Small periaortic lymph nodes about the distal aorta (image 27/16) similar comparison CT. Largest measures up to 1.5 cm. Reproductive: Prostate not evaluated due to significant artifact from the hip prosthetics. Other:  Unremarkable Musculoskeletal: Normal IMPRESSION: 1. Inferior pelvis is not adequately evaluated as there is significant metal artifact generated from the bilateral hip prosthetics. The seminal vesicles,prostate, and bladder not evaluable . 2. Atrophic LEFT kidney and hypertrophy of the RIGHT kidney. No clear acute findings. 3. Mild periaortic lymphadenopathy as seen on comparison CT. Electronically Signed   By: Suzy Bouchard M.D.   On: 01/17/2019 20:41   Ct Abdomen Pelvis W Contrast  Result Date: 01/14/2019 CLINICAL DATA:  Fevers and left lower quadrant pain EXAM: CT  ABDOMEN AND PELVIS WITH CONTRAST TECHNIQUE: Multidetector CT imaging of the abdomen and pelvis was performed using the standard protocol following bolus administration of intravenous contrast. CONTRAST:  158mL ISOVUE-300 IOPAMIDOL (ISOVUE-300) INJECTION 61% COMPARISON:  01/25/2017 FINDINGS: Lower chest: No acute abnormality. Hepatobiliary: The liver demonstrates a large right lobe cyst stable from the previous exam. The gallbladder is decompressed. Pancreas: Unremarkable. No pancreatic ductal dilatation or surrounding inflammatory changes. Spleen: Normal in size without focal abnormality. Adrenals/Urinary Tract: Adrenal glands are within normal limits. The left kidney is again atrophic with dilatation left ureter stable from the prior exam. The left ureter inserts ectopically along the inferior aspect of the bladder stable from prior exam. The bladder is partially distended. The right kidney shows mild perinephric stranding. No focal mass is seen. Fullness of the collecting system and right ureter is seen although no obstructing stone is identified. Stomach/Bowel: The appendix is within normal limits. The colon is decompressed. No small bowel abnormality is noted. The stomach is unremarkable. Vascular/Lymphatic: Aortic atherosclerotic changes are noted. No aneurysmal dilatation is seen. Scattered small periaortic lymph nodes are identified increased in both size and number when compared with the prior exam. Some of these demonstrate normal fatty hila. Some iliac chain lymph nodes are noted. These appear more prominent than the changes in the retroperitoneum. The largest of these measures 15 mm in short axis best seen on image number 81 of series 2. Reproductive: Prostate is within normal limits. Seminal vesicles  are again enlarged with some mild inflammatory change. Localized infection could not be totally excluded. Other: No abdominal wall hernia or abnormality. No abdominopelvic ascites. Musculoskeletal: Bilateral  hip replacements are noted. Degenerative changes of the lumbar spine are seen. IMPRESSION: Chronic changes involving the left kidney. Mild inflammatory changes involving the seminal vesicles. These are somewhat enlarged. The size is stable from the prior CT examination although the degree of surrounding inflammatory change is new. Scattered retroperitoneal and iliac chain lymph nodes. These are new from the prior exam and of uncertain significance. They may be reactive secondary to inflammatory change although a more aggressive process such as lymphoma could not be totally excluded. Further workup would be helpful. Mild perinephric stranding involving the right kidney. No obstructing stone is seen. Possibility of underlying inflammatory change deserves consideration. Electronically Signed   By: Inez Catalina M.D.   On: 01/14/2019 23:48    Microbiology: Recent Results (from the past 240 hour(s))  Urine culture     Status: Abnormal   Collection Time: 01/14/19 10:13 PM  Result Value Ref Range Status   Specimen Description   Final    URINE, CLEAN CATCH Performed at [facility] [address], [place], [place] [address]    Special Requests   Final    NONE Performed at [facility], [place] [address] [place], [place] [address]    Culture 80,000 COLONIES/mL [facility] MORGANII (A)  Final   Report Status 01/17/2019 FINAL  Final   Organism ID, Bacteria MORGANELLA MORGANII (A)  Final      Susceptibility   Morganella morganii - MIC*    AMPICILLIN >=32 RESISTANT Resistant     CEFAZOLIN >=64 RESISTANT Resistant     CEFTRIAXONE <=1 SENSITIVE Sensitive     CIPROFLOXACIN 1 SENSITIVE Sensitive     GENTAMICIN >=16 RESISTANT Resistant     IMIPENEM 1 SENSITIVE Sensitive     NITROFURANTOIN 128 RESISTANT Resistant     TRIMETH/SULFA >=320 RESISTANT Resistant     AMPICILLIN/SULBACTAM >=32 RESISTANT Resistant     PIP/TAZO <=4 SENSITIVE Sensitive     * 80,000 COLONIES/mL  MORGANELLA MORGANII  Blood culture (routine x 2)     Status: None (Preliminary result)   Collection Time: 01/14/19 10:14 PM  Result Value Ref Range Status   Specimen Description   Final    BLOOD RIGHT ANTECUBITAL Performed at [facility], [place] [address], [place], [place] [address]    Special Requests   Final    BOTTLES DRAWN AEROBIC AND ANAEROBIC Blood Culture results may not be optimal due to an excessive volume of blood received in culture bottles Performed at [facility] [address], [place], [place] [address]    Culture   Final    NO GROWTH 3 DAYS Performed at [facility], [place] [address], [place], [place] [address]    Report Status PENDING  Incomplete  Blood culture (routine x 2)     Status: None (Preliminary result)   Collection Time: 01/14/19 10:45 PM  Result Value Ref Range Status   Specimen Description   Final    BLOOD LEFT ANTECUBITAL Performed at [facility] [address] [place], [place] [address]    Special Requests   Final    BOTTLES DRAWN AEROBIC AND ANAEROBIC Blood Culture adequate volume Performed at [facility] [address] [place], [place] [address]    Culture   Final    NO GROWTH  3 DAYS Performed at [facility], [place] [address] [place], [place] [address]    Report Status PENDING  Incomplete     Labs: Basic Metabolic Panel: Recent Labs  Lab 01/14/19 2209 01/16/19 0426 01/17/19 0434 01/18/19 0657  NA 136 135 135 137  K 3.9 4.3 3.8 4.2  CL 106 103 105 105  CO2 22 23 23 24   GLUCOSE 166* 119* 137* 115*  BUN 16 12 12 18   CREATININE 1.22 1.15 1.09 1.03  CALCIUM 8.8* 8.4* 8.1* 8.7*   Liver Function Tests: Recent Labs  Lab 01/14/19 2209 01/17/19 0434  AST 18 22  ALT 15 24  ALKPHOS 71 53  BILITOT 0.8 0.5  PROT 7.1 6.2*  ALBUMIN 3.9 3.2*   No results for input(s): LIPASE, AMYLASE in the last 168 hours. No results for input(s): AMMONIA in the  last 168 hours. CBC: Recent Labs  Lab 01/14/19 2209 01/16/19 0426 01/17/19 0434 01/18/19 0657  WBC 11.5* 11.6* 11.6* 12.3*  NEUTROABS 9.1* 7.7 7.1 8.0*  HGB 13.9 14.0 13.9 14.1  HCT 42.8 43.3 41.7 43.1  MCV 91.1 89.8 88.0 87.6  PLT 279 248 242 282   Cardiac Enzymes: No results for input(s): CKTOTAL, CKMB, CKMBINDEX, TROPONINI in the last 168 hours. BNP: BNP (last 3 results) No results for input(s): BNP in the last 8760 hours.  ProBNP (last 3 results) No results for input(s): PROBNP in the last 8760 hours.  CBG: Recent Labs  Lab 01/18/19 0759 01/18/19 1137 01/18/19 1711 01/18/19 2136 01/19/19 0737  GLUCAP 103* 151* 147* 142* 105*       Signed:  Nita Sells MD   [facility] 01/19/2019, 9:04 AM

## 2019-01-20 LAB — CULTURE, BLOOD (ROUTINE X 2)
Culture: NO GROWTH
Culture: NO GROWTH
Special Requests: ADEQUATE

## 2019-07-04 DEATH — deceased

## 2020-01-25 IMAGING — CT CT ABD-PELV W/ CM
2 of 5 series · 15 of 46 positions shown, 17 images · IV contrast (iopamidol)
Comparison: 01/25/2017

CLINICAL DATA: Fevers and left lower quadrant pain

EXAM:
CT ABDOMEN AND PELVIS WITH CONTRAST
TECHNIQUE: Multidetector CT imaging of the abdomen and pelvis was performed
using the standard protocol following bolus administration of
intravenous contrast.
CONTRAST:  100mL XF99BG-ANN IOPAMIDOL (XF99BG-ANN) INJECTION 61%

[Series 2: axial st · axial · 0.82mm/px · z∈[-466,-21]mm · 12 of 103 slices shown, 14 images]
[im 7/103  soft-tissue]
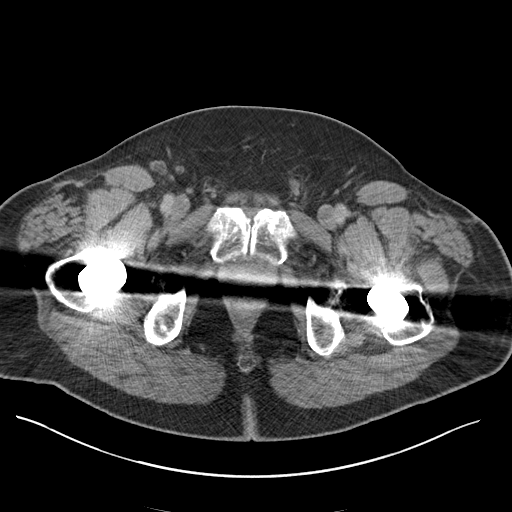
[im 7/103  bone]
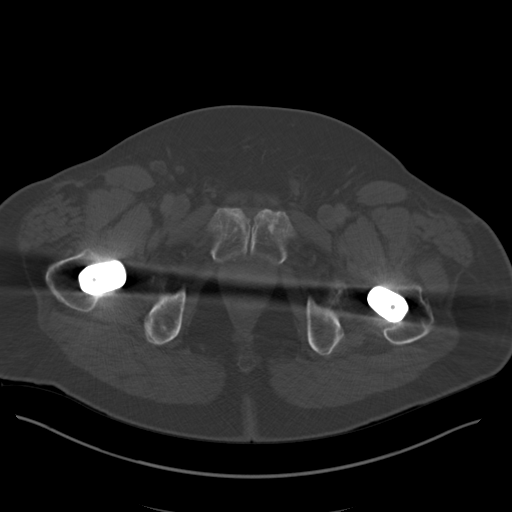
[im 14/103  soft-tissue]
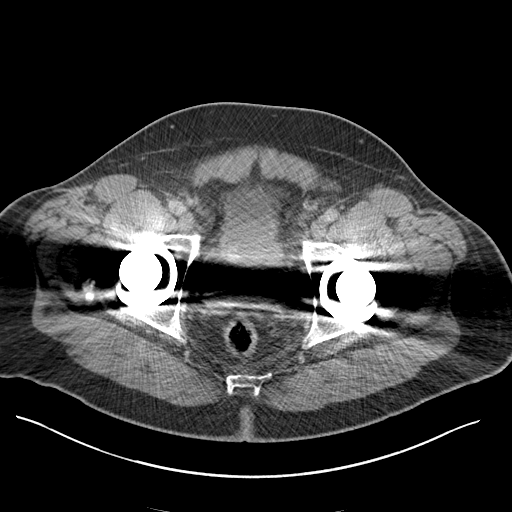
[im 21/103  soft-tissue]
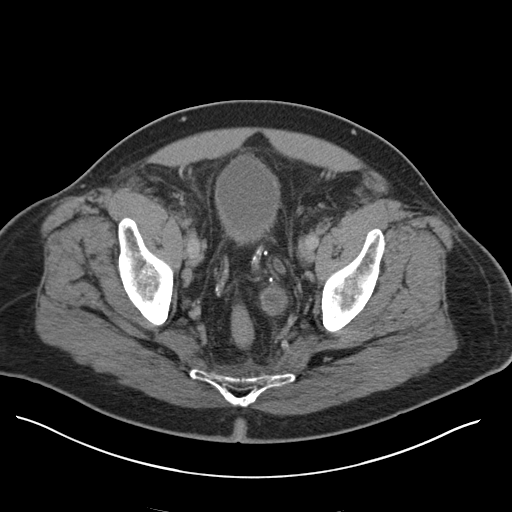
[im 35/103  soft-tissue]
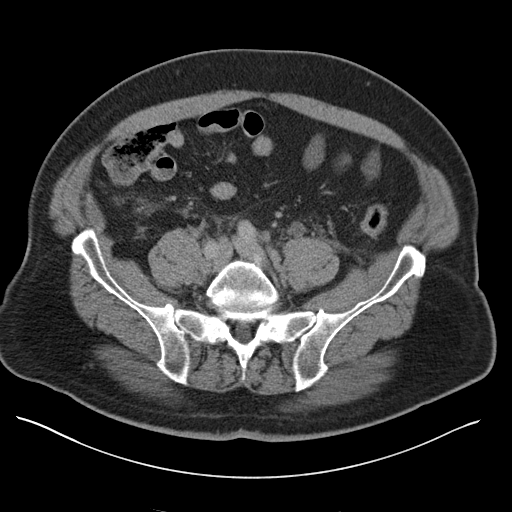
[im 41/103  soft-tissue]
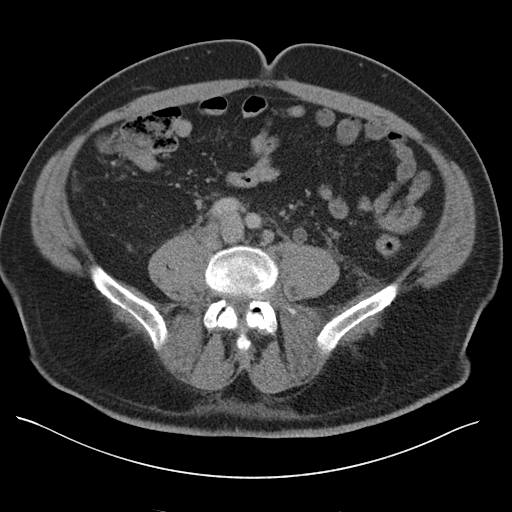
[im 48/103  soft-tissue]
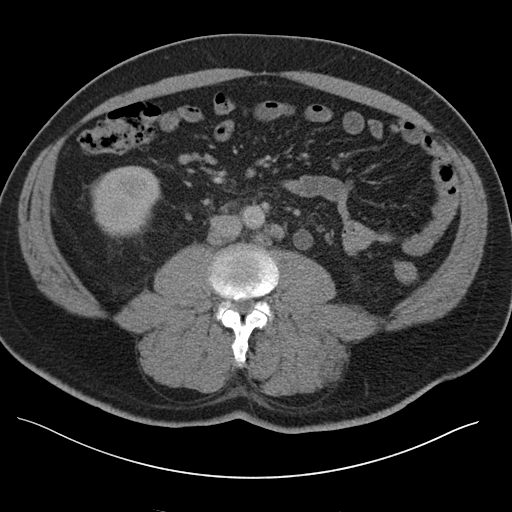
[im 55/103  soft-tissue]
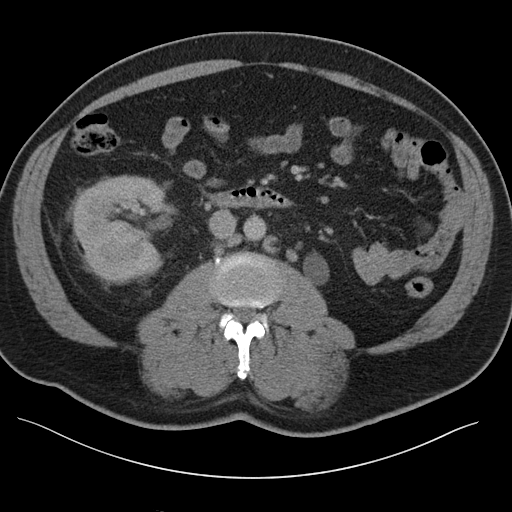
[im 62/103  soft-tissue]
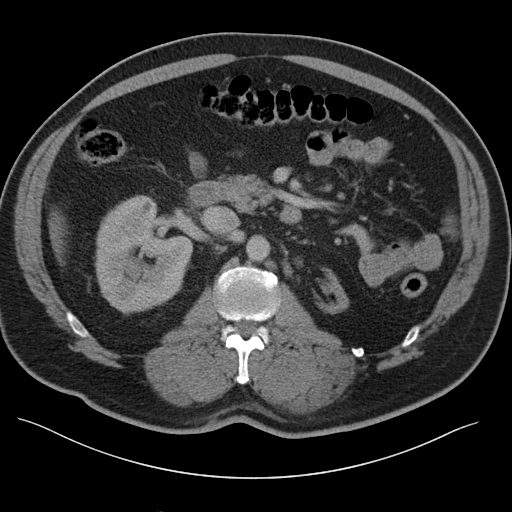
[im 69/103  soft-tissue]
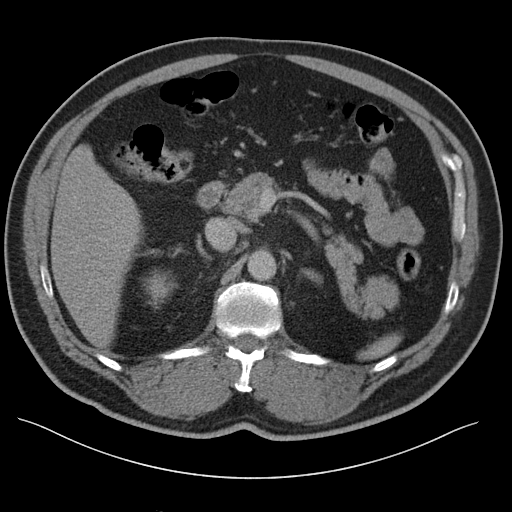
[im 69/103  bone]
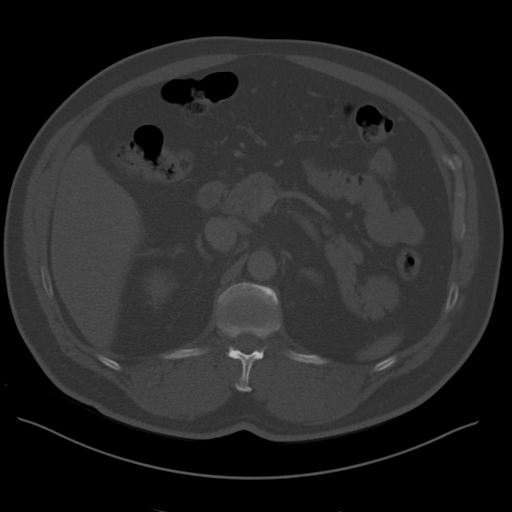
[im 82/103  soft-tissue]
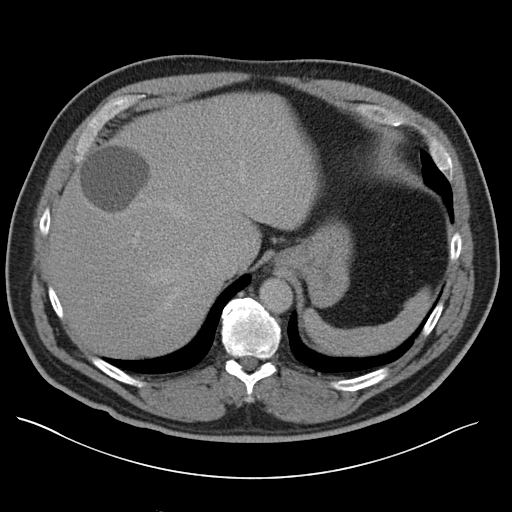
[im 89/103  soft-tissue]
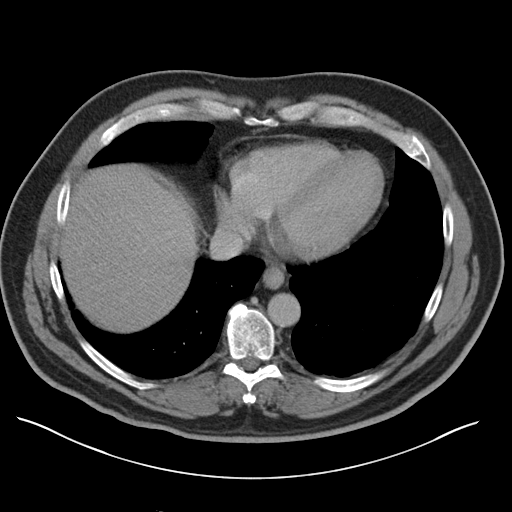
[im 96/103  soft-tissue]
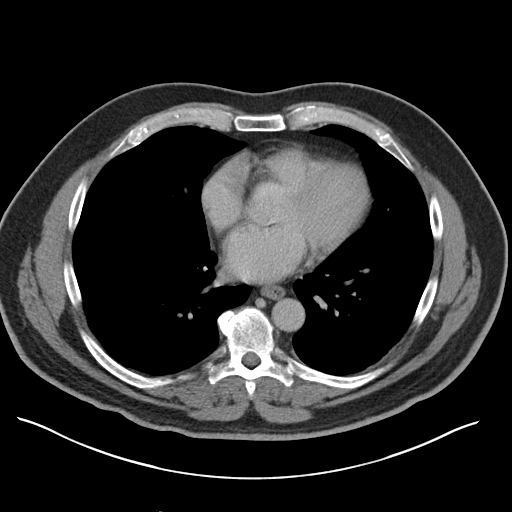

[Series 5: coronal st · coronal · 0.81mm/px · 3 of 153 slices shown]
[im 51/153  soft-tissue]
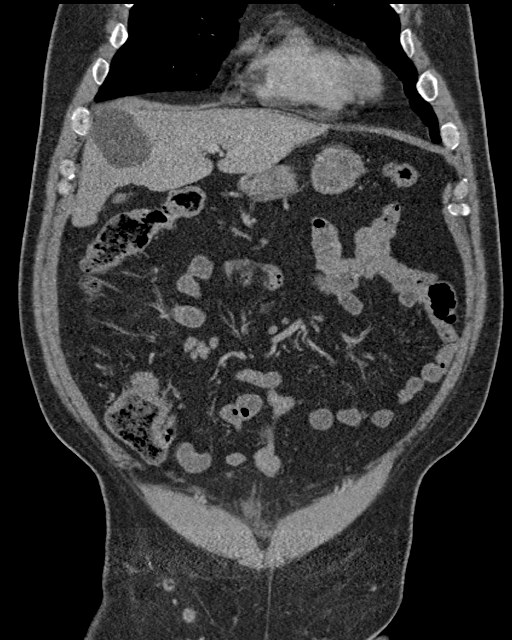
[im 68/153  soft-tissue]
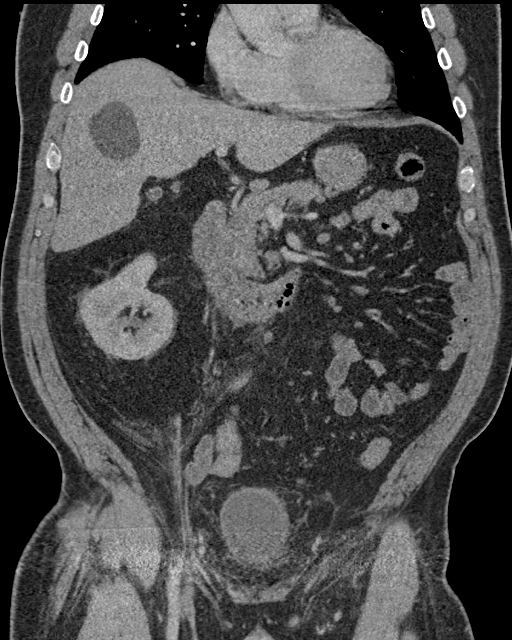
[im 85/153  soft-tissue]
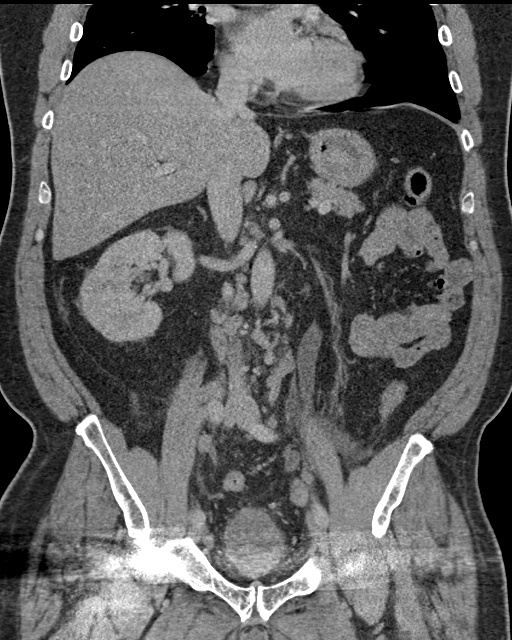

[15 of 46 positions shown; findings below may reference images not displayed]

FINDINGS: Lower chest: No acute abnormality.

Hepatobiliary: The liver demonstrates a large right lobe cyst stable
from the previous exam. The gallbladder is decompressed.

Pancreas: Unremarkable. No pancreatic ductal dilatation or
surrounding inflammatory changes.

Spleen: Normal in size without focal abnormality.

Adrenals/Urinary Tract: Adrenal glands are within normal limits. The
left kidney is again atrophic with dilatation left ureter stable
from the prior exam. The left ureter inserts ectopically along the
inferior aspect of the bladder stable from prior exam. The bladder
is partially distended. The right kidney shows mild perinephric
stranding. No focal mass is seen. Fullness of the collecting system
and right ureter is seen although no obstructing stone is
identified.

Stomach/Bowel: The appendix is within normal limits. The colon is
decompressed. No small bowel abnormality is noted. The stomach is
unremarkable.

Vascular/Lymphatic: Aortic atherosclerotic changes are noted. No
aneurysmal dilatation is seen. Scattered small periaortic lymph
nodes are identified increased in both size and number when compared
with the prior exam. Some of these demonstrate normal fatty hila.
Some iliac chain lymph nodes are noted. These appear more prominent
than the changes in the retroperitoneum. The largest of these
measures 15 mm in short axis best seen on image number 81 of series
2.

Reproductive: Prostate is within normal limits. Seminal vesicles are
again enlarged with some mild inflammatory change. Localized
infection could not be totally excluded.

Other: No abdominal wall hernia or abnormality. No abdominopelvic
ascites.

Musculoskeletal: Bilateral hip replacements are noted. Degenerative
changes of the lumbar spine are seen.
IMPRESSION: Chronic changes involving the left kidney.

Mild inflammatory changes involving the seminal vesicles. These are
somewhat enlarged. The size is stable from the prior CT examination
although the degree of surrounding inflammatory change is new.

Scattered retroperitoneal and iliac chain lymph nodes. These are new
from the prior exam and of uncertain significance. They may be
reactive secondary to inflammatory change although a more aggressive
process such as lymphoma could not be totally excluded. Further
workup would be helpful.

Mild perinephric stranding involving the right kidney. No
obstructing stone is seen. Possibility of underlying inflammatory
change deserves consideration.
